# Patient Record
Sex: Male | Born: 1935 | Hispanic: No | Marital: Married | Smoking: Never smoker
Health system: Southern US, Community
[De-identification: ages and names within clinical notes are randomized; demographics above are authoritative.]

## PROBLEM LIST (undated history)

## (undated) DIAGNOSIS — J45909 Unspecified asthma, uncomplicated: Secondary | ICD-10-CM

## (undated) DIAGNOSIS — E079 Disorder of thyroid, unspecified: Secondary | ICD-10-CM

## (undated) DIAGNOSIS — I639 Cerebral infarction, unspecified: Secondary | ICD-10-CM

## (undated) DIAGNOSIS — I10 Essential (primary) hypertension: Secondary | ICD-10-CM

---

## 2013-10-09 ENCOUNTER — Ambulatory Visit: Payer: Self-pay | Admitting: Physician Assistant

## 2013-10-22 ENCOUNTER — Encounter (HOSPITAL_COMMUNITY): Payer: Self-pay | Admitting: Emergency Medicine

## 2013-10-22 ENCOUNTER — Emergency Department (HOSPITAL_COMMUNITY): Payer: BC Managed Care – PPO

## 2013-10-22 ENCOUNTER — Emergency Department (HOSPITAL_COMMUNITY)
Admission: EM | Admit: 2013-10-22 | Discharge: 2013-10-23 | Disposition: A | Discharge status: Home / Self Care | Payer: BC Managed Care – PPO | Attending: Emergency Medicine | Admitting: Emergency Medicine

## 2013-10-22 ENCOUNTER — Emergency Department (INDEPENDENT_AMBULATORY_CARE_PROVIDER_SITE_OTHER)
Admission: EM | Admit: 2013-10-22 | Discharge: 2013-10-22 | Disposition: A | Discharge status: Nursing Home (Includes ICF) | Payer: BC Managed Care – PPO | Source: Home / Self Care | Attending: Family Medicine | Admitting: Family Medicine

## 2013-10-22 DIAGNOSIS — Y92009 Unspecified place in unspecified non-institutional (private) residence as the place of occurrence of the external cause: Secondary | ICD-10-CM | POA: Insufficient documentation

## 2013-10-22 DIAGNOSIS — S59909A Unspecified injury of unspecified elbow, initial encounter: Secondary | ICD-10-CM | POA: Insufficient documentation

## 2013-10-22 DIAGNOSIS — Z7902 Long term (current) use of antithrombotics/antiplatelets: Secondary | ICD-10-CM | POA: Insufficient documentation

## 2013-10-22 DIAGNOSIS — W19XXXA Unspecified fall, initial encounter: Secondary | ICD-10-CM

## 2013-10-22 DIAGNOSIS — S40029A Contusion of unspecified upper arm, initial encounter: Secondary | ICD-10-CM

## 2013-10-22 DIAGNOSIS — S79919A Unspecified injury of unspecified hip, initial encounter: Secondary | ICD-10-CM | POA: Insufficient documentation

## 2013-10-22 DIAGNOSIS — S40021A Contusion of right upper arm, initial encounter: Secondary | ICD-10-CM

## 2013-10-22 DIAGNOSIS — S6990XA Unspecified injury of unspecified wrist, hand and finger(s), initial encounter: Secondary | ICD-10-CM | POA: Insufficient documentation

## 2013-10-22 DIAGNOSIS — Z79899 Other long term (current) drug therapy: Secondary | ICD-10-CM | POA: Insufficient documentation

## 2013-10-22 DIAGNOSIS — I10 Essential (primary) hypertension: Secondary | ICD-10-CM | POA: Insufficient documentation

## 2013-10-22 DIAGNOSIS — S4980XA Other specified injuries of shoulder and upper arm, unspecified arm, initial encounter: Secondary | ICD-10-CM | POA: Insufficient documentation

## 2013-10-22 DIAGNOSIS — G8190 Hemiplegia, unspecified affecting unspecified side: Secondary | ICD-10-CM | POA: Insufficient documentation

## 2013-10-22 DIAGNOSIS — W010XXA Fall on same level from slipping, tripping and stumbling without subsequent striking against object, initial encounter: Secondary | ICD-10-CM | POA: Insufficient documentation

## 2013-10-22 DIAGNOSIS — Z8673 Personal history of transient ischemic attack (TIA), and cerebral infarction without residual deficits: Secondary | ICD-10-CM | POA: Insufficient documentation

## 2013-10-22 DIAGNOSIS — Y9389 Activity, other specified: Secondary | ICD-10-CM | POA: Insufficient documentation

## 2013-10-22 DIAGNOSIS — S46909A Unspecified injury of unspecified muscle, fascia and tendon at shoulder and upper arm level, unspecified arm, initial encounter: Secondary | ICD-10-CM | POA: Insufficient documentation

## 2013-10-22 DIAGNOSIS — J45909 Unspecified asthma, uncomplicated: Secondary | ICD-10-CM | POA: Insufficient documentation

## 2013-10-22 HISTORY — DX: Disorder of thyroid, unspecified: E07.9

## 2013-10-22 HISTORY — DX: Unspecified asthma, uncomplicated: J45.909

## 2013-10-22 HISTORY — DX: Cerebral infarction, unspecified: I63.9

## 2013-10-22 HISTORY — DX: Essential (primary) hypertension: I10

## 2013-10-22 LAB — POCT I-STAT, CHEM 8
BUN: 16 mg/dL (ref 6–23)
Calcium, Ion: 1.32 mmol/L — ABNORMAL HIGH (ref 1.13–1.30)
Chloride: 101 meq/L (ref 96–112)
Creatinine, Ser: 1 mg/dL (ref 0.50–1.35)
Glucose, Bld: 111 mg/dL — ABNORMAL HIGH (ref 70–99)
HCT: 34 % — ABNORMAL LOW (ref 39.0–52.0)
Hemoglobin: 11.6 g/dL — ABNORMAL LOW (ref 13.0–17.0)
Potassium: 4.3 meq/L (ref 3.5–5.1)
Sodium: 140 meq/L (ref 135–145)
TCO2: 28 mmol/L (ref 0–100)

## 2013-10-22 NOTE — ED Notes (Signed)
Pt. transferred from [facility] , pt. fell at home inside toilet this evening pt. family equesting x-rays on his right elbow ( swelling ) , right shoulder and right hip . Pt.has a history of CVA with right hemiplegia .

## 2013-10-22 NOTE — ED Provider Notes (Signed)
CSN: [id number]     Arrival date & time 10/22/13  2004 History   First MD Initiated Contact with Patient 10/22/13 2029     Chief Complaint  Patient presents with  . Fall   (Consider location/radiation/quality/duration/timing/severity/associated sxs/prior Treatment) Patient is a 79 y.o. male presenting with fall.  Fall   Language; [place]  Patient's brought in by wife and son for evaluation of injuries after a fall that happened earlier today. He has history of previous stroke that left him hemiplegic on the right side. While on the toilet the patient describes accidentally loosing his balance and slipping off the toilet falling onto his right elbow, right shoulder and right hip. The patient denies hitting his head or LOC but the son is not convinced because he fell alone and they had to pick him up off of the ground. He went to his religious ceremony and has been acting normal all day, eating and drinking. The patient has his doctorate in zoology and still is mentally sharp. He says that he does have a little bit of pain. Pt has an abrasion to right elbow.  Past Medical History  Diagnosis Date  . Stroke   . Thyroid disease   . Hypertension   . Asthma    History reviewed. No pertinent past surgical history. No family history on file. History  Substance Use Topics  . Smoking status: Never Smoker   . Smokeless tobacco: Not on file  . Alcohol Use: No    Review of Systems  ROS The patient denies anorexia, fever, weight loss,, vision loss, decreased hearing, hoarseness, chest pain, syncope, dyspnea on exertion, peripheral edema, balance deficits, hemoptysis, abdominal pain, melena, hematochezia, severe indigestion/heartburn, hematuria, incontinence, genital sores, muscle weakness, suspicious skin lesions, transient blindness, difficulty walking, depression, unusual weight change, abnormal bleeding, enlarged lymph nodes, angioedema, and breast masses.  Allergies  Review of patient's  allergies indicates no known allergies.  Home Medications   Current Outpatient Rx  Name  Route  Sig  Dispense  Refill  . amLODipine (NORVASC) 2.5 MG tablet   Oral   Take 2.5 mg by mouth daily before breakfast.         . atorvastatin (LIPITOR) 40 MG tablet   Oral   Take 40 mg by mouth at bedtime.         . calcium carbonate (OS-CAL - DOSED IN MG OF ELEMENTAL CALCIUM) 1250 MG tablet   Oral   Take 1 tablet by mouth 2 (two) times daily with a meal.         . Cholecalciferol (VITAMIN D) 2000 UNITS CAPS   Oral   Take 2,000 Units by mouth daily after breakfast.         . clopidogrel (PLAVIX) 75 MG tablet   Oral   Take 75 mg by mouth daily after breakfast.         . finasteride (PROSCAR) 5 MG tablet   Oral   Take 5 mg by mouth daily after breakfast.         . levothyroxine (SYNTHROID, LEVOTHROID) 75 MCG tablet   Oral   Take 75 mcg by mouth daily before breakfast.         . mirabegron ER (MYRBETRIQ) 25 MG TB24 tablet   Oral   Take 25 mg by mouth daily after breakfast.         . pyridOXINE (VITAMIN B-6) 100 MG tablet   Oral   Take 200 mg by mouth daily after breakfast.         .  QUEtiapine (SEROQUEL) 25 MG tablet   Oral   Take 25 mg by mouth at bedtime.         . senna-docusate (SENOKOT-S) 8.6-50 MG per tablet   Oral   Take 1-2 tablets by mouth 2 (two) times daily. 2 tablets right after breakfast and 1 tablet right after dinner         . silodosin (RAPAFLO) 8 MG CAPS capsule   Oral   Take 8 mg by mouth daily after supper.         . sodium chloride (OCEAN) 0.65 % nasal spray   Nasal   Place 2 sprays into the nose 4 (four) times daily.          BP 141/87  Pulse 73  Temp(Src) 99.3 F (37.4 C) (Oral)  Resp 18  SpO2 99% Physical Exam  Nursing note and vitals reviewed. Constitutional: He appears well-developed and well-nourished. No distress.  HENT:  Head: Normocephalic and atraumatic.  Eyes: Pupils are equal, round, and reactive to  light.  Neck: Normal range of motion. Neck supple.  Cardiovascular: Normal rate and regular rhythm.   Pulmonary/Chest: Effort normal.  Abdominal: Soft.  Musculoskeletal:  Pt has muscle atrophy due to inability to use elbow. No noticeable deformities. Pt has no strength at baseline. Some mild swelling and abrasions radial side of elbow.  The shoulder does not appear to be out of place. No bruising, induration, erythema. Pt has no strength at baseline.  Patient does not have feeling in right hip at baseline. Pt has no strength at baseline. No crepitus or obvious deformities. No ecchymosis, hematoma, or induration noted.    Neurological: He is alert.  Skin: Skin is warm and dry.    ED Course  Procedures (including critical care time) Labs Review Labs Reviewed  POCT I-STAT, CHEM 8 - Abnormal; Notable for the following:    Glucose, Bld 111 (*)    Calcium, Ion 1.32 (*)    Hemoglobin 11.6 (*)    HCT 34.0 (*)    All other components within normal limits   Imaging Review Dg Shoulder Right  10/22/2013   CLINICAL DATA:  Right shoulder pain following a fall.  EXAM: RIGHT SHOULDER - 2+ VIEW  COMPARISON:  None.  FINDINGS: Diffuse osteopenia. No fracture or dislocation seen. Mild to moderate inferior acromioclavicular spur formation.  IMPRESSION: No fracture or dislocation.   Electronically Signed   By: Gordan Payment M.D.   On: 10/22/2013 23:10   Dg Elbow Complete Right  10/22/2013   CLINICAL DATA:  Right elbow pain following a fall.  EXAM: RIGHT ELBOW - COMPLETE 3+ VIEW  COMPARISON:  None.  FINDINGS: The lateral view is limited by limited ability to positioned the patient. There is a fragmented olecranon spur. There is also spur formation at the radial head and neck junction, making it difficult to assess for a fracture in the region. Also noted is mild olecranon and anterior humeral spur formation. No gross effusion on the lateral view. Diffuse osteopenia.  IMPRESSION: 1. Chronically fragmented  versus acutely fractured olecranon spur. 2. Radial head and neck spur formation, making it difficult to assess for a fracture in that region. 3. Additional degenerative spur formation, as described above. 4. Limited lateral view with no gross effusion. 5.   Electronically Signed   By: Gordan Payment M.D.   On: 10/22/2013 23:20   Dg Hip Complete Right  10/22/2013   CLINICAL DATA:  Right hip pain following a fall.  EXAM:  RIGHT HIP - COMPLETE 2+ VIEW  COMPARISON:  None.  FINDINGS: Diffuse osteopenia. No fracture or dislocation seen. Lower lumbar spine degenerative changes.  IMPRESSION: No fracture or dislocation.   Electronically Signed   By: Gordan Payment M.D.   On: 10/22/2013 23:09   Ct Head Wo Contrast  10/22/2013   CLINICAL DATA:  Fall  EXAM: CT HEAD WITHOUT CONTRAST  TECHNIQUE: Contiguous axial images were obtained from the base of the skull through the vertex without intravenous contrast.  COMPARISON:  None.  FINDINGS: There is age-related atrophy with chronic microvascular ischemic disease.  There is extensive encephalomalacia throughout the left frontal and anterior temporal lobes, consistent with remote left MCA territory infarct. There is involvement of the left basal ganglia as well. There is associated mild ex vacuo dilatation of the left lateral ventricle. There is a homogeneous hypodense extra-axial fluid collection overlying the left cerebral hemisphere measuring 7 mm in maximal diameter, most compatible with a subdural hygroma related to volume loss. Scattered areas of encephalomalacia within the anterior right frontal lobe (series 2, image 17) and right centrum semi ovale (series 2, image 19) are also consistent with remote infarcts. Remote left occipital infarct also noted. No midline shift. No hydrocephalus. No acute intracranial hemorrhage or infarct identified.  The calvarium is intact. Orbits are within normal limits. Scattered mucoperiosteal thickening is present within the ethmoidal air cells  bilaterally. Paranasal sinuses are otherwise clear. No mastoid effusion.  IMPRESSION: 1. No acute intracranial process. 2. Extensive encephalomalacia throughout the left frontal and temporal lobes, compatible with remote left MCA territory infarct. 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 No midline shift or hydrocephalus. 3. Additional remote right frontal and left occipital infarcts as above. 4. Atrophy with chronic microvascular ischemic changes.   Electronically Signed   By: Rise Mu M.D.   On: 10/22/2013 23:02    EKG Interpretation   None       MDM   1. Fall at home, initial encounter   Dr. Judd Lien and I spoke with patient and family together. CT scan shows no new changes and xray shows possible small fracture through the olecranon spur. Otherwise no broken bones or concerning findings. Patient is safe to go home and may continue to do physical therapy at home.  79 y.o.[name] [name]'s evaluation in the Emergency Department is complete. It has been determined that no acute conditions requiring further emergency intervention are present at this time. The patient/guardian have been advised of the diagnosis and plan. We have discussed signs and symptoms that warrant return to the ED, such as changes or worsening in symptoms.  Vital signs are stable at discharge. Filed Vitals:   10/22/13 2305  BP: 141/87  Pulse: 73  Temp:   Resp: 18    Patient/guardian has voiced understanding and agreed to follow-up with the PCP or specialist.     Dorthula Matas, PA-C 10/22/13 2346

## 2013-10-22 NOTE — ED Provider Notes (Signed)
Medical screening examination/treatment/procedure(s) were performed by non-physician practitioner and as supervising physician I was immediately available for consultation/collaboration.  EKG Interpretation   None        Valicia Rief, MD 10/22/13 2359 

## 2013-10-22 NOTE — ED Notes (Signed)
Phlebotomy at bedside.

## 2013-10-22 NOTE — ED Notes (Signed)
79 yr old c/o shoulder, elbow pain - riight x today. Pt fell today No other complaints

## 2013-10-22 NOTE — ED Notes (Signed)
[place], PA at bedside  

## 2013-10-22 NOTE — ED Provider Notes (Addendum)
CSN: [id number]     Arrival date & time 10/22/13  1833 History   First MD Initiated Contact with Patient 10/22/13 1909     Chief Complaint  Patient presents with  . Shoulder Pain   (Consider location/radiation/quality/duration/timing/severity/associated sxs/prior Treatment) Patient is a 79 y.o. male presenting with shoulder pain. The history is provided by the patient, the spouse and a relative.  Shoulder Pain This is a new problem. The current episode started 3 to 5 hours ago (h/o right sided cva , fell off of toilet and landed on right arm, family concerned  about possible arm or hip fx.). The problem has not changed since onset.Pertinent negatives include no chest pain, no abdominal pain and no shortness of breath.    Past Medical History  Diagnosis Date  . Stroke    History reviewed. No pertinent past surgical history. No family history on file. History  Substance Use Topics  . Smoking status: Never Smoker   . Smokeless tobacco: Not on file  . Alcohol Use: No    Review of Systems  Constitutional: Negative.   Respiratory: Negative for shortness of breath.   Cardiovascular: Negative for chest pain.  Gastrointestinal: Negative for abdominal pain.  Musculoskeletal: Positive for gait problem. Negative for joint swelling and neck pain.  Skin: Positive for wound.    Allergies  Review of patient's allergies indicates no known allergies.  Home Medications  No current outpatient prescriptions on file. BP 159/71  Pulse 77  Temp(Src) 98.4 F (36.9 C) (Oral)  Resp 18  SpO2 97% Physical Exam  Nursing note and vitals reviewed. Constitutional: He is oriented to person, place, and time. He appears well-developed and well-nourished.  HENT:  Head: Normocephalic and atraumatic.  Musculoskeletal: He exhibits no tenderness.  Hip flexion intact, no apparent injury.  Neurological: He is alert and oriented to person, place, and time.  Skin: Skin is warm and dry.  Right shoulder  abrasion and elbow contusion, no palp deformity.    ED Course  Procedures (including critical care time) Labs Review Labs Reviewed - No data to display Imaging Review No results found.  EKG Interpretation     Ventricular Rate:    PR Interval:    QRS Duration:   QT Interval:    QTC Calculation:   R Axis:     Text Interpretation:              MDM   1. Arm contusion, right, initial encounter    Sent for x-ray eval of arm and possibly hip to r/o fx.   Linna Hoff, MD 10/22/13 4098  Linna Hoff, MD 10/22/13 2006

## 2013-11-15 ENCOUNTER — Inpatient Hospital Stay (HOSPITAL_COMMUNITY): Payer: BC Managed Care – PPO

## 2013-11-15 ENCOUNTER — Inpatient Hospital Stay (HOSPITAL_COMMUNITY)
Admission: EM | Admit: 2013-11-15 | Discharge: 2013-11-19 | DRG: 100 | Disposition: A | Discharge status: Home / Self Care | Payer: BC Managed Care – PPO | Attending: Family Medicine | Admitting: Family Medicine

## 2013-11-15 ENCOUNTER — Encounter (HOSPITAL_COMMUNITY): Payer: Self-pay | Admitting: Radiology

## 2013-11-15 ENCOUNTER — Emergency Department (HOSPITAL_COMMUNITY): Payer: BC Managed Care – PPO

## 2013-11-15 DIAGNOSIS — W19XXXA Unspecified fall, initial encounter: Secondary | ICD-10-CM | POA: Diagnosis present

## 2013-11-15 DIAGNOSIS — I69922 Dysarthria following unspecified cerebrovascular disease: Secondary | ICD-10-CM

## 2013-11-15 DIAGNOSIS — E039 Hypothyroidism, unspecified: Secondary | ICD-10-CM | POA: Diagnosis present

## 2013-11-15 DIAGNOSIS — S065X9A Traumatic subdural hemorrhage with loss of consciousness of unspecified duration, initial encounter: Secondary | ICD-10-CM

## 2013-11-15 DIAGNOSIS — E079 Disorder of thyroid, unspecified: Secondary | ICD-10-CM | POA: Diagnosis present

## 2013-11-15 DIAGNOSIS — I69992 Facial weakness following unspecified cerebrovascular disease: Secondary | ICD-10-CM

## 2013-11-15 DIAGNOSIS — R569 Unspecified convulsions: Principal | ICD-10-CM | POA: Diagnosis present

## 2013-11-15 DIAGNOSIS — I6350 Cerebral infarction due to unspecified occlusion or stenosis of unspecified cerebral artery: Secondary | ICD-10-CM

## 2013-11-15 DIAGNOSIS — J45909 Unspecified asthma, uncomplicated: Secondary | ICD-10-CM | POA: Diagnosis present

## 2013-11-15 DIAGNOSIS — I639 Cerebral infarction, unspecified: Secondary | ICD-10-CM | POA: Diagnosis present

## 2013-11-15 DIAGNOSIS — S065XAA Traumatic subdural hemorrhage with loss of consciousness status unknown, initial encounter: Secondary | ICD-10-CM | POA: Diagnosis present

## 2013-11-15 DIAGNOSIS — I6200 Nontraumatic subdural hemorrhage, unspecified: Secondary | ICD-10-CM

## 2013-11-15 DIAGNOSIS — I69959 Hemiplegia and hemiparesis following unspecified cerebrovascular disease affecting unspecified side: Secondary | ICD-10-CM

## 2013-11-15 DIAGNOSIS — E785 Hyperlipidemia, unspecified: Secondary | ICD-10-CM | POA: Diagnosis present

## 2013-11-15 DIAGNOSIS — Y998 Other external cause status: Secondary | ICD-10-CM

## 2013-11-15 DIAGNOSIS — I10 Essential (primary) hypertension: Secondary | ICD-10-CM | POA: Diagnosis present

## 2013-11-15 DIAGNOSIS — Z7902 Long term (current) use of antithrombotics/antiplatelets: Secondary | ICD-10-CM

## 2013-11-15 DIAGNOSIS — G9389 Other specified disorders of brain: Secondary | ICD-10-CM | POA: Diagnosis present

## 2013-11-15 DIAGNOSIS — Y92009 Unspecified place in unspecified non-institutional (private) residence as the place of occurrence of the external cause: Secondary | ICD-10-CM

## 2013-11-15 LAB — CBC
HCT: 34.6 % — ABNORMAL LOW (ref 39.0–52.0)
Hemoglobin: 11.1 g/dL — ABNORMAL LOW (ref 13.0–17.0)
MCH: 27.5 pg (ref 26.0–34.0)
MCHC: 32.1 g/dL (ref 30.0–36.0)
MCV: 85.6 fL (ref 78.0–100.0)
Platelets: 218 10*3/uL (ref 150–400)

## 2013-11-15 LAB — COMPREHENSIVE METABOLIC PANEL
ALT: 18 U/L (ref 0–53)
Albumin: 2.8 g/dL — ABNORMAL LOW (ref 3.5–5.2)
Alkaline Phosphatase: 88 U/L (ref 39–117)
BUN: 15 mg/dL (ref 6–23)
Calcium: 9.3 mg/dL (ref 8.4–10.5)
GFR calc Af Amer: >90 mL/min (ref >90)
Glucose, Bld: 147 mg/dL — ABNORMAL HIGH (ref 70–99)
Potassium: 4.1 mEq/L (ref 3.5–5.1)
Sodium: 138 mEq/L (ref 135–145)
Total Protein: 7.5 g/dL (ref 6.0–8.3)

## 2013-11-15 LAB — POCT I-STAT, CHEM 8
Calcium, Ion: 1.31 mmol/L — ABNORMAL HIGH (ref 1.13–1.30)
Chloride: 99 mEq/L (ref 96–112)
HCT: 34 % — ABNORMAL LOW (ref 39.0–52.0)
TCO2: 27 mmol/L (ref 0–100)

## 2013-11-15 LAB — PROTIME-INR
INR: 0.93 (ref 0.00–1.49)
Prothrombin Time: 12.3 seconds (ref 11.6–15.2)

## 2013-11-15 LAB — POCT I-STAT TROPONIN I: Troponin i, poc: 0 ng/mL (ref 0.00–0.08)

## 2013-11-15 LAB — DIFFERENTIAL
Basophils Relative: 1 % (ref 0–1)
Eosinophils Absolute: 0.1 10*3/uL (ref 0.0–0.7)
Eosinophils Relative: 2 % (ref 0–5)
Lymphs Abs: 1.8 10*3/uL (ref 0.7–4.0)
Neutrophils Relative %: 64 % (ref 43–77)

## 2013-11-15 LAB — TROPONIN I: Troponin I: <0.3 ng/mL (ref <0.30)

## 2013-11-15 LAB — GLUCOSE, CAPILLARY: Glucose-Capillary: 123 mg/dL — ABNORMAL HIGH (ref 70–99)

## 2013-11-15 MED ORDER — ALBUTEROL SULFATE (5 MG/ML) 0.5% IN NEBU: 2.5000 mg | INHALATION_SOLUTION | RESPIRATORY_TRACT | Status: DC | PRN

## 2013-11-15 MED ORDER — MIRABEGRON ER 25 MG PO TB24
25.0000 mg | ORAL_TABLET | Freq: Every day | ORAL | Status: DC
  Administered 2013-11-16 – 2013-11-19 (×4): 25 mg via ORAL
  Filled 2013-11-15 (×5): qty 1

## 2013-11-15 MED ORDER — POLYETHYLENE GLYCOL 3350 17 G PO PACK
17.0000 g | PACK | Freq: Every day | ORAL | Status: DC | PRN
  Filled 2013-11-15: qty 1

## 2013-11-15 MED ORDER — SENNOSIDES-DOCUSATE SODIUM 8.6-50 MG PO TABS
1.0000 | ORAL_TABLET | Freq: Two times a day (BID) | ORAL | Status: DC
  Filled 2013-11-15: qty 2

## 2013-11-15 MED ORDER — SENNOSIDES-DOCUSATE SODIUM 8.6-50 MG PO TABS
2.0000 | ORAL_TABLET | Freq: Every day | ORAL | Status: DC
  Administered 2013-11-16 – 2013-11-19 (×4): 2 via ORAL
  Filled 2013-11-15 (×4): qty 2

## 2013-11-15 MED ORDER — ATORVASTATIN CALCIUM 40 MG PO TABS
40.0000 mg | ORAL_TABLET | Freq: Every day | ORAL | Status: DC
  Administered 2013-11-16 – 2013-11-18 (×3): 40 mg via ORAL
  Filled 2013-11-15 (×5): qty 1

## 2013-11-15 MED ORDER — HYDROCODONE-ACETAMINOPHEN 5-325 MG PO TABS: 1.0000 | ORAL_TABLET | ORAL | Status: DC | PRN

## 2013-11-15 MED ORDER — GUAIFENESIN-DM 100-10 MG/5ML PO SYRP: 5.0000 mL | ORAL_SOLUTION | ORAL | Status: DC | PRN

## 2013-11-15 MED ORDER — TAMSULOSIN HCL 0.4 MG PO CAPS
0.4000 mg | ORAL_CAPSULE | Freq: Every day | ORAL | Status: DC
  Administered 2013-11-16 – 2013-11-19 (×4): 0.4 mg via ORAL
  Filled 2013-11-15 (×5): qty 1

## 2013-11-15 MED ORDER — LEVOTHYROXINE SODIUM 75 MCG PO TABS
75.0000 ug | ORAL_TABLET | Freq: Every day | ORAL | Status: DC
  Administered 2013-11-16 – 2013-11-19 (×4): 75 ug via ORAL
  Filled 2013-11-15 (×5): qty 1

## 2013-11-15 MED ORDER — QUETIAPINE FUMARATE 25 MG PO TABS
25.0000 mg | ORAL_TABLET | Freq: Every day | ORAL | Status: DC
  Administered 2013-11-16 – 2013-11-18 (×3): 25 mg via ORAL
  Filled 2013-11-15 (×5): qty 1

## 2013-11-15 MED ORDER — SENNOSIDES-DOCUSATE SODIUM 8.6-50 MG PO TABS
1.0000 | ORAL_TABLET | Freq: Every day | ORAL | Status: DC
  Administered 2013-11-16 – 2013-11-18 (×2): 1 via ORAL
  Filled 2013-11-15 (×2): qty 1

## 2013-11-15 MED ORDER — ALUM & MAG HYDROXIDE-SIMETH 200-200-20 MG/5ML PO SUSP: 30.0000 mL | Freq: Four times a day (QID) | ORAL | Status: DC | PRN

## 2013-11-15 MED ORDER — SODIUM CHLORIDE 0.9 % IV SOLN
INTRAVENOUS | Status: AC
  Administered 2013-11-15: 18:00:00 via INTRAVENOUS

## 2013-11-15 MED ORDER — ONDANSETRON HCL 4 MG/2ML IJ SOLN: 4.0000 mg | Freq: Four times a day (QID) | INTRAMUSCULAR | Status: DC | PRN

## 2013-11-15 MED ORDER — LEVETIRACETAM 500 MG/5ML IV SOLN
1000.0000 mg | INTRAVENOUS | Status: AC
  Administered 2013-11-15: 1000 mg via INTRAVENOUS
  Filled 2013-11-15: qty 10

## 2013-11-15 MED ORDER — AMLODIPINE BESYLATE 2.5 MG PO TABS
2.5000 mg | ORAL_TABLET | Freq: Every day | ORAL | Status: DC
  Administered 2013-11-16 – 2013-11-19 (×4): 2.5 mg via ORAL
  Filled 2013-11-15 (×5): qty 1

## 2013-11-15 MED ORDER — HYDRALAZINE HCL 20 MG/ML IJ SOLN: 10.0000 mg | Freq: Four times a day (QID) | INTRAMUSCULAR | Status: DC | PRN

## 2013-11-15 MED ORDER — SODIUM CHLORIDE 0.9 % IV SOLN
500.0000 mg | Freq: Two times a day (BID) | INTRAVENOUS | Status: DC
  Administered 2013-11-16 – 2013-11-17 (×3): 500 mg via INTRAVENOUS
  Filled 2013-11-15 (×5): qty 5

## 2013-11-15 MED ORDER — ONDANSETRON HCL 4 MG PO TABS: 4.0000 mg | ORAL_TABLET | Freq: Four times a day (QID) | ORAL | Status: DC | PRN

## 2013-11-15 MED ORDER — FINASTERIDE 5 MG PO TABS
5.0000 mg | ORAL_TABLET | Freq: Every day | ORAL | Status: DC
  Administered 2013-11-16 – 2013-11-19 (×4): 5 mg via ORAL
  Filled 2013-11-15 (×5): qty 1

## 2013-11-15 MED ORDER — SODIUM CHLORIDE 0.9 % IJ SOLN
3.0000 mL | Freq: Two times a day (BID) | INTRAMUSCULAR | Status: DC
  Administered 2013-11-16 – 2013-11-19 (×7): 3 mL via INTRAVENOUS

## 2013-11-15 NOTE — ED Notes (Signed)
Checked patient blood sugar it was 142 notified RN Jennaya of blood sugar
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
## 2013-11-15 NOTE — H&P (Signed)
[facility]                                                                                    Patient Demographics  [name] [name], is a 79 y.o. male  MRN: [id number]   DOB - 1930/05/02  Admit Date - 11/15/2013  Outpatient Primary MD for the patient is Provider Not In System   With History of -  Past Medical History  Diagnosis Date  . Stroke   . Thyroid disease   . Hypertension   . Asthma       History reviewed. No pertinent past surgical history.  in for   Chief Complaint  Patient presents with  . Code Stroke     HPI  [name] [name]  is a 79 y.o. male, who has had left MCA territory ischemic stroke in the past causing a sizable encephalomalacia, right-sided hemiparesis secondary to previous CVA, hypertension, asthma, hypothyroidism, dyslipidemia who sustained a fall at his house about a month ago at which point he was brought in the ER and had an unremarkable workup except for head CT showing a subdural hygroma, he was subsequently discharged home and did well.    This afternoon patient had an episode of generalized tonic-clonic seizure which lasted a few seconds followed by a brief postictal phase, EMS was called however by the time EMS arrived patient was already recovering and was getting close to his baseline. He was then brought to the ER where her head CT now showed a subacute subdural hematoma with 3 mm midline shift, patient's mentation was close to his baseline and had no new focal deficits. He was seen in the ER by neurosurgeon Dr. Danielle Dess who requested me to admit the patient. Patient was also seen by neuro hospitalist team and was started on Keppra to prevent seizure recurrence.    Review of Systems  Negative currently  In addition to the HPI above,   No Fever-chills, No Headache, No changes with Vision or hearing, No problems swallowing food or Liquids, No Chest pain, Cough or Shortness of Breath, No Abdominal pain, No Nausea or Vommitting, Bowel movements  are regular, No Blood in stool or Urine, No dysuria, No new skin rashes or bruises, No new joints pains-aches,  No new weakness, tingling, numbness in any extremity, No recent weight gain or loss, No polyuria, polydypsia or polyphagia, No significant Mental Stressors.  A full 10 point Review of Systems was done, except as stated above, all other Review of Systems were negative.   Social History History  Substance Use Topics  . Smoking status: Never Smoker   . Smokeless tobacco: Not on file  . Alcohol Use: No      Family History  No H/O CVA    Prior to Admission medications   Medication Sig Start Date End Date Taking? Authorizing Provider  amLODipine (NORVASC) 2.5 MG tablet Take 2.5 mg by mouth daily before breakfast.    Historical Provider, MD  atorvastatin (LIPITOR) 40 MG tablet Take 40 mg by mouth at bedtime.    Historical Provider, MD  calcium carbonate (OS-CAL - DOSED IN MG OF ELEMENTAL CALCIUM) 1250 MG tablet Take 1 tablet  by mouth 2 (two) times daily with a meal.    Historical Provider, MD  Cholecalciferol (VITAMIN D) 2000 UNITS CAPS Take 2,000 Units by mouth daily after breakfast.    Historical Provider, MD  clopidogrel (PLAVIX) 75 MG tablet Take 75 mg by mouth daily after breakfast.    Historical Provider, MD  finasteride (PROSCAR) 5 MG tablet Take 5 mg by mouth daily after breakfast.    Historical Provider, MD  levothyroxine (SYNTHROID, LEVOTHROID) 75 MCG tablet Take 75 mcg by mouth daily before breakfast.    Historical Provider, MD  mirabegron ER (MYRBETRIQ) 25 MG TB24 tablet Take 25 mg by mouth daily after breakfast.    Historical Provider, MD  pyridOXINE (VITAMIN B-6) 100 MG tablet Take 200 mg by mouth daily after breakfast.    Historical Provider, MD  QUEtiapine (SEROQUEL) 25 MG tablet Take 25 mg by mouth at bedtime.    Historical Provider, MD  senna-docusate (SENOKOT-S) 8.6-50 MG per tablet Take 1-2 tablets by mouth 2 (two) times daily. 2 tablets right after  breakfast and 1 tablet right after dinner    Historical Provider, MD  silodosin (RAPAFLO) 8 MG CAPS capsule Take 8 mg by mouth daily after supper.    Historical Provider, MD  sodium chloride (OCEAN) 0.65 % nasal spray Place 2 sprays into the nose 4 (four) times daily.    Historical Provider, MD    No Known Allergies  Physical Exam  Vitals  Blood pressure 155/85, pulse 74, temperature 98 F (36.7 C), resp. rate 20, weight 64.581 kg (142 lb 6 oz), SpO2 100.00%.   1. General elderly male lying in bed in NAD,     2. Normal affect and insight, Not Suicidal or Homicidal, Awake Alert, Oriented X 3.  3. No F.N deficits, ALL C.Nerves Intact, Strength 5/5 left sided extremities and 0-1/5 R.side, Sensation intact L. extremities, Plantars down going on Left.  4. Ears and Eyes appear Normal, Conjunctivae clear, PERRLA. Moist Oral Mucosa.  5. Supple Neck, No JVD, No cervical lymphadenopathy appriciated, No Carotid Bruits.  6. Symmetrical Chest wall movement, Good air movement bilaterally, CTAB.  7. RRR, No Gallops, Rubs or Murmurs, No Parasternal Heave.  8. Positive Bowel Sounds, Abdomen Soft, Non tender, No organomegaly appriciated,No rebound -guarding or rigidity.  9.  No Cyanosis, Normal Skin Turgor, No Skin Rash or Bruise.  10. Good muscle tone,  joints appear normal , no effusions, Normal ROM.  11. No Palpable Lymph Nodes in Neck or Axillae     Data Review  CBC  Recent Labs Lab 11/15/13 1600 11/15/13 1611  WBC 6.8  --   HGB 11.1* 11.6*  HCT 34.6* 34.0*  PLT 218  --   MCV 85.6  --   MCH 27.5  --   MCHC 32.1  --   RDW 14.0  --   LYMPHSABS 1.8  --   MONOABS 0.5  --   EOSABS 0.1  --   BASOSABS 0.0  --    ------------------------------------------------------------------------------------------------------------------  Chemistries   Recent Labs Lab 11/15/13 1600 11/15/13 1611  NA 138 140  K 4.1 4.0  CL 101 99  CO2 28  --   GLUCOSE 147* 146*  BUN 15 15   CREATININE 0.78 1.10  CALCIUM 9.3  --   AST 22  --   ALT 18  --   ALKPHOS 88  --   BILITOT 0.1*  --    ------------------------------------------------------------------------------------------------------------------ CrCl is unknown because there is no height on file for  the current visit. ------------------------------------------------------------------------------------------------------------------ No results found for this basename: TSH, T4TOTAL, FREET3, T3FREE, THYROIDAB,  in the last 72 hours   Coagulation profile  Recent Labs Lab 11/15/13 1600  INR 0.93   ------------------------------------------------------------------------------------------------------------------- No results found for this basename: DDIMER,  in the last 72 hours -------------------------------------------------------------------------------------------------------------------  Cardiac Enzymes  Recent Labs Lab 11/15/13 1600  TROPONINI <0.30   ------------------------------------------------------------------------------------------------------------------ No components found with this basename: POCBNP,    ---------------------------------------------------------------------------------------------------------------  Urinalysis No results found for this basename: colorurine, appearanceur, labspec, phurine, glucoseu, hgbur, bilirubinur, ketonesur, proteinur, urobilinogen, nitrite, leukocytesur    ----------------------------------------------------------------------------------------------------------------  Imaging results:   Dg Shoulder Right  10/22/2013   CLINICAL DATA:  Right shoulder pain following a fall.  EXAM: RIGHT SHOULDER - 2+ VIEW  COMPARISON:  None.  FINDINGS: Diffuse osteopenia. No fracture or dislocation seen. Mild to moderate inferior acromioclavicular spur formation.  IMPRESSION: No fracture or dislocation.   Electronically Signed   By: Gordan Payment M.D.   On: 10/22/2013  23:10   Dg Elbow Complete Right  10/22/2013   CLINICAL DATA:  Right elbow pain following a fall.  EXAM: RIGHT ELBOW - COMPLETE 3+ VIEW  COMPARISON:  None.  FINDINGS: The lateral view is limited by limited ability to positioned the patient. There is a fragmented olecranon spur. There is also spur formation at the radial head and neck junction, making it difficult to assess for a fracture in the region. Also noted is mild olecranon and anterior humeral spur formation. No gross effusion on the lateral view. Diffuse osteopenia.  IMPRESSION: 1. Chronically fragmented versus acutely fractured olecranon spur. 2. Radial head and neck spur formation, making it difficult to assess for a fracture in that region. 3. Additional degenerative spur formation, as described above. 4. Limited lateral view with no gross effusion. 5.   Electronically Signed   By: Gordan Payment M.D.   On: 10/22/2013 23:20   Dg Hip Complete Right  10/22/2013   CLINICAL DATA:  Right hip pain following a fall.  EXAM: RIGHT HIP - COMPLETE 2+ VIEW  COMPARISON:  None.  FINDINGS: Diffuse osteopenia. No fracture or dislocation seen. Lower lumbar spine degenerative changes.  IMPRESSION: No fracture or dislocation.   Electronically Signed   By: Gordan Payment M.D.   On: 10/22/2013 23:09   Ct Head (brain) Wo Contrast  11/15/2013   CLINICAL DATA:  Code stroke  EXAM: CT HEAD WITHOUT CONTRAST  TECHNIQUE: Contiguous axial images were obtained from the base of the skull through the vertex without intravenous contrast.  COMPARISON:  None.  FINDINGS: Large area of chronic infarct in the left MCA territory. Chronic right frontal infarct. Chronic infarct in the right parietal white matter.  Large mixed density subdural hematoma on the left with a hematocrit level. This measures 24 mm in thickness. Smaller area of higher density hematoma in the subdural space posteriorly on the left.  3 mm midline shift to the right related to extensive volume loss from chronic left MCA  infarct. No acute ischemic infarct or mass.  IMPRESSION: Atrophy and chronic ischemia. Large area of chronic infarct in the left MCA territory.  Large mixed density subacute subdural hematoma on the left with 3 mm midline shift.  Critical Value/emergent results were called by telephone at the time of interpretation on 11/15/2013 at 4:05 PM to Dr.Camilo , who verbally acknowledged these results.   Electronically Signed   By: Marlan Palau M.D.   On: 11/15/2013 16:06   Ct Head Wo Contrast  10/22/2013   CLINICAL DATA:  Fall  EXAM: CT HEAD WITHOUT CONTRAST  TECHNIQUE: Contiguous axial images were obtained from the base of the skull through the vertex without intravenous contrast.  COMPARISON:  None.  FINDINGS: There is age-related atrophy with chronic microvascular ischemic disease.  There is extensive encephalomalacia throughout the left frontal and anterior temporal lobes, consistent with remote left MCA territory infarct. There is involvement of the left basal ganglia as well. There is associated mild ex vacuo dilatation of the left lateral ventricle. There is a homogeneous hypodense extra-axial fluid collection overlying the left cerebral hemisphere measuring 7 mm in maximal diameter, most compatible with a subdural hygroma related to volume loss. Scattered areas of encephalomalacia within the anterior right frontal lobe (series 2, image 17) and right centrum semi ovale (series 2, image 19) are also consistent with remote infarcts. Remote left occipital infarct also noted. No midline shift. No hydrocephalus. No acute intracranial hemorrhage or infarct identified.  The calvarium is intact. Orbits are within normal limits. Scattered mucoperiosteal thickening is present within the ethmoidal air cells bilaterally. Paranasal sinuses are otherwise clear. No mastoid effusion.  IMPRESSION: 1. No acute intracranial process. 2. Extensive encephalomalacia throughout the left frontal and temporal lobes, compatible with  remote left MCA territory infarct. 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 No midline shift or hydrocephalus. 3. Additional remote right frontal and left occipital infarcts as above. 4. Atrophy with chronic microvascular ischemic changes.   Electronically Signed   By: Rise Mu M.D.   On: 10/22/2013 23:02    My personal review of EKG: Rhythm NSR,   no Acute ST changes    Assessment & Plan   1. Generalized tonic-clonic seizure likely secondary to subdural hematoma with midline shift - will be admitted to step down, Keppra has been initiated and will be continued, will obtain EEG to rule out any ongoing seizure activity, his mental status is close to his baseline now. Neurology has been consulted and they will continue to follow the patient. We will continue to observe seizure precautions. Will have RN in evaluate his swallow function bedside and advance diet as tolerated to heart healthy.    2. Subdural hematoma. Likely secondary to his fall one month ago, he does not recall whether he hit his head, likely he did hit his head and with his large encephalomalacia from previous CVA in him being on Plavix he has had some bleed. He is being seen by neurosurgeon Dr. Danielle Dess, for now as he recommends supportive care only, we will hold his Plavix, fall precautions. Frequent neuro checks and monitor closely in stepped-down.   3. Hypertension. Stable his home medications will be continued, IV hydralazine and with written parameters.    4. Hypothyroidism. Continue home dose Synthroid check baseline TSH.    5. History of ischemic CVA in the past with severe right-sided hemiparesis and some dysarthria. For now and updated medications will be held second subdural hematoma, continue statin for secondary prevention. PT OT and speech evaluation.      DVT Prophylaxis  SCDs    AM Labs Ordered, also please  review Full Orders  Family Communication: Admission, patients condition and plan of care including tests being ordered have been discussed with the patient and Family who indicate understanding and agree with the plan and Code Status.  Code Status Full  Likely DC to Home  Condition GUARDED   Time spent in minutes :  35    Leroy Sea M.D on 11/15/2013 at 6:16 PM  Between 7am to 7pm - Pager - [phone]  After 7pm go to [email or web address] - password [email or web address]  And look for the night coverage person covering me after hours  [facility] Office  [phone]

## 2013-11-15 NOTE — ED Notes (Signed)
CBG 123. Notified RN 

## 2013-11-15 NOTE — Consult Note (Signed)
Reason for Consult: Subdural hematoma on left. Referring Physician: Dr. Sonny Dandy is an 79 y.o. male.  HPI: Patient is an 79 year old right-handed individual who's had a previous left-sided stroke affecting his speech and also right-sided movement the patient had what appears to be a seizure that started with a typical jacksonian march involving the right upper extremity and then becoming generalized. A typical postictal phase. He is now returned to his normal neurologic status where he has a substantial right-sided and he plegia. A CT scan demonstrates the presence of a left-sided subdural hematoma that is subacute and chronic. There is little mass effect from this hematoma as there is substantial atrophy from the patient's stroke involving the right  frontal temporal and parietal regions.  Past Medical History  Diagnosis Date  . Stroke   . Thyroid disease   . Hypertension   . Asthma     History reviewed. No pertinent past surgical history.  History reviewed. No pertinent family history.  Social History:  reports that he has never smoked. He does not have any smokeless tobacco history on file. He reports that he does not drink alcohol or use illicit drugs.  Allergies: No Known Allergies  Medications: I have reviewed the patient's current medications.  Results for orders placed during the hospital encounter of 11/15/13 (from the past 48 hour(s))  GLUCOSE, CAPILLARY     Status: Abnormal   Collection Time    11/15/13  3:47 PM      Result Value Range   Glucose-Capillary 142 (*) 70 - 99 mg/dL  PROTIME-INR     Status: None   Collection Time    11/15/13  4:00 PM      Result Value Range   Prothrombin Time 12.3  11.6 - 15.2 seconds   INR 0.93  0.00 - 1.49  APTT     Status: Abnormal   Collection Time    11/15/13  4:00 PM      Result Value Range   aPTT 23 (*) 24 - 37 seconds  CBC     Status: Abnormal   Collection Time    11/15/13  4:00 PM      Result Value Range   WBC  6.8  4.0 - 10.5 K/uL   RBC 4.04 (*) 4.22 - 5.81 MIL/uL   Hemoglobin 11.1 (*) 13.0 - 17.0 g/dL   HCT 40.9 (*) 81.1 - 91.4 %   MCV 85.6  78.0 - 100.0 fL   MCH 27.5  26.0 - 34.0 pg   MCHC 32.1  30.0 - 36.0 g/dL   RDW 78.2  95.6 - 21.3 %   Platelets 218  150 - 400 K/uL  DIFFERENTIAL     Status: None   Collection Time    11/15/13  4:00 PM      Result Value Range   Neutrophils Relative % 64  43 - 77 %   Neutro Abs 4.4  1.7 - 7.7 K/uL   Lymphocytes Relative 27  12 - 46 %   Lymphs Abs 1.8  0.7 - 4.0 K/uL   Monocytes Relative 7  3 - 12 %   Monocytes Absolute 0.5  0.1 - 1.0 K/uL   Eosinophils Relative 2  0 - 5 %   Eosinophils Absolute 0.1  0.0 - 0.7 K/uL   Basophils Relative 1  0 - 1 %   Basophils Absolute 0.0  0.0 - 0.1 K/uL  COMPREHENSIVE METABOLIC PANEL     Status: Abnormal   Collection  Time    11/15/13  4:00 PM      Result Value Range   Sodium 138  135 - 145 mEq/L   Potassium 4.1  3.5 - 5.1 mEq/L   Chloride 101  96 - 112 mEq/L   CO2 28  19 - 32 mEq/L   Glucose, Bld 147 (*) 70 - 99 mg/dL   BUN 15  6 - 23 mg/dL   Creatinine, Ser 1.61  0.50 - 1.35 mg/dL   Calcium 9.3  8.4 - 09.6 mg/dL   Total Protein 7.5  6.0 - 8.3 g/dL   Albumin 2.8 (*) 3.5 - 5.2 g/dL   AST 22  0 - 37 U/L   ALT 18  0 - 53 U/L   Alkaline Phosphatase 88  39 - 117 U/L   Total Bilirubin 0.1 (*) 0.3 - 1.2 mg/dL   GFR calc non Af Amer 82 (*) >90 mL/min   GFR calc Af Amer >90  >90 mL/min   Comment: (NOTE)     The eGFR has been calculated using the CKD EPI equation.     This calculation has not been validated in all clinical situations.     eGFR's persistently <90 mL/min signify possible Chronic Kidney     Disease.  TROPONIN I     Status: None   Collection Time    11/15/13  4:00 PM      Result Value Range   Troponin I <0.30  <0.30 ng/mL   Comment:            Due to the release kinetics of cTnI,     a negative result within the first hours     of the onset of symptoms does not rule out     myocardial  infarction with certainty.     If myocardial infarction is still suspected,     repeat the test at appropriate intervals.  POCT I-STAT TROPONIN I     Status: None   Collection Time    11/15/13  4:08 PM      Result Value Range   Troponin i, poc 0.00  0.00 - 0.08 ng/mL   Comment 3            Comment: Due to the release kinetics of cTnI,     a negative result within the first hours     of the onset of symptoms does not rule out     myocardial infarction with certainty.     If myocardial infarction is still suspected,     repeat the test at appropriate intervals.  POCT I-STAT, CHEM 8     Status: Abnormal   Collection Time    11/15/13  4:11 PM      Result Value Range   Sodium 140  135 - 145 mEq/L   Potassium 4.0  3.5 - 5.1 mEq/L   Chloride 99  96 - 112 mEq/L   BUN 15  6 - 23 mg/dL   Creatinine, Ser 0.45  0.50 - 1.35 mg/dL   Glucose, Bld 409 (*) 70 - 99 mg/dL   Calcium, Ion 8.11 (*) 1.13 - 1.30 mmol/L   TCO2 27  0 - 100 mmol/L   Hemoglobin 11.6 (*) 13.0 - 17.0 g/dL   HCT 91.4 (*) 78.2 - 95.6 %  GLUCOSE, CAPILLARY     Status: Abnormal   Collection Time    11/15/13  9:11 PM      Result Value Range   Glucose-Capillary 123 (*) 70 -  99 mg/dL    Dg Chest 2 View  16/12/958   CLINICAL DATA:  Subdural hematoma, admission radiograph  EXAM: CHEST  2 VIEW  COMPARISON:  None.  FINDINGS: Heart size within normal limits. Mild aortic tortuosity and central vascular prominence. No confluent airspace opacity, pleural effusion, or pneumothorax. Multilevel degenerative changes. No acute osseous finding.  IMPRESSION: No radiographic evidence of an acute cardiopulmonary process.   Electronically Signed   By: Jearld Lesch M.D.   On: 11/15/2013 23:24   Ct Head (brain) Wo Contrast  11/15/2013   CLINICAL DATA:  Code stroke  EXAM: CT HEAD WITHOUT CONTRAST  TECHNIQUE: Contiguous axial images were obtained from the base of the skull through the vertex without intravenous contrast.  COMPARISON:  None.   FINDINGS: Large area of chronic infarct in the left MCA territory. Chronic right frontal infarct. Chronic infarct in the right parietal white matter.  Large mixed density subdural hematoma on the left with a hematocrit level. This measures 24 mm in thickness. Smaller area of higher density hematoma in the subdural space posteriorly on the left.  3 mm midline shift to the right related to extensive volume loss from chronic left MCA infarct. No acute ischemic infarct or mass.  IMPRESSION: Atrophy and chronic ischemia. Large area of chronic infarct in the left MCA territory.  Large mixed density subacute subdural hematoma on the left with 3 mm midline shift.  Critical Value/emergent results were called by telephone at the time of interpretation on 11/15/2013 at 4:05 PM to Dr.Camilo , who verbally acknowledged these results.   Electronically Signed   By: Marlan Palau M.D.   On: 11/15/2013 16:06    Review of Systems  Constitutional: Negative.   HENT: Negative.   Eyes: Negative.   Cardiovascular: Negative.   Gastrointestinal: Negative.   Genitourinary: Negative.   Musculoskeletal:       And he plegia on right side of arm and leg  Skin: Negative.   Neurological:       Hemiplegia on right side of arm and let  Psychiatric/Behavioral: Negative.    Blood pressure 163/87, pulse 74, temperature 98.3 F (36.8 C), temperature source Oral, resp. rate 21, height 5' 4.5" (1.638 m), weight 63.1 kg (139 lb 1.8 oz), SpO2 99.00%. Physical Exam  Constitutional: He appears well-developed and well-nourished.  HENT:  Head: Normocephalic and atraumatic.  Eyes: Conjunctivae are normal. Pupils are equal, round, and reactive to light.  Neck: Normal range of motion. Neck supple.  Cardiovascular: Normal rate and regular rhythm.   Respiratory: Effort normal and breath sounds normal.  GI: Soft. Bowel sounds are normal.  Musculoskeletal:  Right-sided hemiplegia involving arm and leg. Some spastic movement of her right  lower extremity. Patient also has right facial.  Neurological:  Mild right facial. Right hemiplegia noted with significant spasticity. Absent reflexes in biceps triceps patellae and the Achilles.  Skin: Skin is warm.  Psychiatric: He has a normal mood and affect. His behavior is normal. Judgment and thought content normal.    Assessment/Plan: Patient is an 78 year old individual who has been on Plavix and aspirin secondary to ischemic strokes he now has evidence of a subacute and chronic subdural hematoma and recent new onset of seizures. I've advised that we treat seizures conservatively with medication and bring them under control. We did stop his Plavix and aspirin. At some point the patient may need surgical decompression of this large subdural hematoma however I noted to the family that unless it worsens or he  clinically deteriorates or his seizures are unable to be controlled I do not see a need for surgery at this time. Will follow with you.  Kaiyla Stahly J 11/15/2013, 11:37 PM

## 2013-11-15 NOTE — Consult Note (Signed)
Referring Physician: Silverio Lay    Chief Complaint: seizure brought in as code stroke.  HPI:                                                                                                                                         [name] [name] is an 79 y.o. male who was with his family when at 14:15 he was noted to have abnormal facial movements, then lost consciousness. By EMS arrival he has regained consciousness but was still slightly confused and dysarthric.  Due to his previous left MCA stroke patient is baseline dysarthric, left hemiparetic, and decreased sensation on left arm arm and leg. Currently he is improving but remains dysarthric but improving. He has a notable left part of his tongue which has been bitten.   Date last known well: Date: 11/15/2013 Time last known well: Time: 14:15 tPA Given: No: bleed  Past Medical History  Diagnosis Date  . Stroke   . Thyroid disease   . Hypertension   . Asthma     No past surgical history on file.  No family history on file. Social History:  reports that he has never smoked. He does not have any smokeless tobacco history on file. He reports that he does not drink alcohol or use illicit drugs.  Allergies: No Known Allergies  Medications:                                                                                                                           Current Facility-Administered Medications  Medication Dose Route Frequency Provider Last Rate Last Dose  . levETIRAcetam (KEPPRA) 1,000 mg in sodium chloride 0.9 % 100 mL IVPB  1,000 mg Intravenous STAT Ulice Dash, PA-C      . levETIRAcetam (KEPPRA) 500 mg in sodium chloride 0.9 % 100 mL IVPB  500 mg Intravenous Q12H Ulice Dash, PA-C       Current Outpatient Prescriptions  Medication Sig Dispense Refill  . amLODipine (NORVASC) 2.5 MG tablet Take 2.5 mg by mouth daily before breakfast.      . atorvastatin (LIPITOR) 40 MG tablet Take 40 mg by mouth at bedtime.      . calcium  carbonate (OS-CAL - DOSED IN MG OF ELEMENTAL CALCIUM) 1250 MG tablet Take 1 tablet by mouth 2 (two) times daily with a meal.      .  Cholecalciferol (VITAMIN D) 2000 UNITS CAPS Take 2,000 Units by mouth daily after breakfast.      . clopidogrel (PLAVIX) 75 MG tablet Take 75 mg by mouth daily after breakfast.      . finasteride (PROSCAR) 5 MG tablet Take 5 mg by mouth daily after breakfast.      . levothyroxine (SYNTHROID, LEVOTHROID) 75 MCG tablet Take 75 mcg by mouth daily before breakfast.      . mirabegron ER (MYRBETRIQ) 25 MG TB24 tablet Take 25 mg by mouth daily after breakfast.      . pyridOXINE (VITAMIN B-6) 100 MG tablet Take 200 mg by mouth daily after breakfast.      . QUEtiapine (SEROQUEL) 25 MG tablet Take 25 mg by mouth at bedtime.      . senna-docusate (SENOKOT-S) 8.6-50 MG per tablet Take 1-2 tablets by mouth 2 (two) times daily. 2 tablets right after breakfast and 1 tablet right after dinner      . silodosin (RAPAFLO) 8 MG CAPS capsule Take 8 mg by mouth daily after supper.      . sodium chloride (OCEAN) 0.65 % nasal spray Place 2 sprays into the nose 4 (four) times daily.         ROS:                                                                                                                                       History obtained from unobtainable from patient due to language barrier   Neurologic Examination:                                                                                                      Blood pressure 160/105, pulse 97, resp. rate 18, SpO2 100.00%.  Mental Status: Alert, oriented.  Speech dysarthric (old) without evidence of aphasia.  Able to follow simple commands without difficulty. Cranial Nerves: II: Discs flat bilaterally; Visual fields decrease right visual fiald, pupils equal, round, reactive to light and accommodation III,IV, VI: ptosis not present, extra-ocular motions intact bilaterally V,VII: smile asymmetric on the right, facial light  touch sensation decreased on the right VIII: hearing normal bilaterally IX,X: gag reflex present XI: bilateral shoulder shrug XII: midline tongue extension without atrophy or fasciculations  Motor: Right : Upper extremity   5/5    Left:     Upper extremity   0/5  Lower extremity   5/5     Lower extremity   2/5 proximal 0/5 distal Tone  and bulk:normal tone throughout; no atrophy noted Sensory: Pinprick and light touch intact throughout, bilaterally Deep Tendon Reflexes:  Right: Upper Extremity   Left: Upper extremity   biceps (C-5 to C-6) 2/4   biceps (C-5 to C-6) 2/4 tricep (C7) 2/4    triceps (C7) 2/4 Brachioradialis (C6) 2/4  Brachioradialis (C6) 2/4  Lower Extremity Lower Extremity  0/4 bilateral  Plantars: Mute bilateral Cerebellar: normal finger-to-nose on the left,  normal heel-to-shin test Gait: not tested due to acuity CV: pulses palpable throughout    No results found for this or any previous visit (from the past 48 hour(s)). No results found.  Assessment and plan discussed with with attending physician and they are in agreement.    Stroke Risk Factors - hypertension and stroke   Felicie Morn PA-C [facility] [phone]  11/15/2013, 3:49 PM   Assessment: 79 y.o. male brought to hospital after abnormal lip movements and loss of consciousness. CT head shows a large mixed density subacute subdural hematoma on the left with 3 mm midline shift.  Given history of abnormal lip movements and AMS likely seizure in the setting of acute on chronic SDH. Family has requested to have Dr. Pearlean Brownie see in AM  Recommend: 1) BP control with SBP <160 2) Step down ICU 3) Stop all antiplatelets 4) repeat CT head in AM 5) Continue Keppra 500 mg BID  Patient seen and examined together with physician assistant and I concur with the assessment and plan.  Wyatt Portela, MD

## 2013-11-15 NOTE — ED Provider Notes (Signed)
I saw and evaluated the patient, reviewed the resident's note and I agree with the findings and plan.  EKG Interpretation    Date/Time:  Wednesday November 15 2013 15:45:27 EST Ventricular Rate:  93 PR Interval:  177 QRS Duration: 79 QT Interval:  353 QTC Calculation: 439 R Axis:   9 Text Interpretation:  Sinus rhythm Abnormal R-wave progression, early transition No previous ECGs available Confirmed by YAO  MD, DAVID [phone]) on 11/15/2013 4:05:02 PM            [name] [name] is a 79 y.o. male hx of stroke with residual R sided weakness here with seizure, expressive aphasia. Went to take a nap around 1:45 PM today. Around 2:12 PM, he called daughter in law and wanted water. She gave him the water and his eyes rolled up and then he had lip smacking movement and tonic clonic shakes. Patient came in as code stroke. Larey Seat a month ago and had chronic subdural. On exam, he had R sided weakness (chronic), slurred speech. CT head showed subdural. I called neurosurgery, who recommend no intervention. Recommend admit to step down. Will admit to [facility] under [facility].   CRITICAL CARE Performed by: Silverio Lay, DAVID   Total critical care time: 30 min   Critical care time was exclusive of separately billable procedures and treating other patients.  Critical care was necessary to treat or prevent imminent or life-threatening deterioration.  Critical care was time spent personally by me on the following activities: development of treatment plan with patient and/or surrogate as well as nursing, discussions with consultants, evaluation of patient's response to treatment, examination of patient, obtaining history from patient or surrogate, ordering and performing treatments and interventions, ordering and review of laboratory studies, ordering and review of radiographic studies, pulse oximetry and re-evaluation of patient's condition.    Richardean Canal, MD 11/15/13 [phone]

## 2013-11-15 NOTE — ED Provider Notes (Signed)
CSN: [id number]     Arrival date & time 11/15/13  1518 History   First MD Initiated Contact with Patient 11/15/13 1536     Chief Complaint  Patient presents with  . Code Stroke   (Consider location/radiation/quality/duration/timing/severity/associated sxs/prior Treatment) The history is provided by the spouse.   Chief complaint: Weakness/altered mental status  79 year old male past medical history significant for previous CVA, thyroid disease, hypertension with altered mental status. Patient was in usual state of health until time for nap this afternoon. Patient was reported to down for nap approximately 2:00 PM. Around 2:50 PM family noticed patient was having lipsmacking. Also noted to have work finding difficulties above baseline and some shaking. Last unknown time. Patient had one episode of consciousness. Slowly resolving. And interventions taken. At baseline patient does have right-sided paralysis secondary to previous CVA. Symptoms were initially severe, however improving. For all other associated signs and symptoms refer to review of systems section of this chart   Past Medical History  Diagnosis Date  . Stroke   . Thyroid disease   . Hypertension   . Asthma    History reviewed. No pertinent past surgical history. History reviewed. No pertinent family history. History  Substance Use Topics  . Smoking status: Never Smoker   . Smokeless tobacco: Not on file  . Alcohol Use: No    Review of Systems  Unable to perform ROS: Acuity of condition    Allergies  Review of patient's allergies indicates no known allergies.  Home Medications   Current Outpatient Rx  Name  Route  Sig  Dispense  Refill  . amLODipine (NORVASC) 2.5 MG tablet   Oral   Take 2.5 mg by mouth daily before breakfast.         . atorvastatin (LIPITOR) 40 MG tablet   Oral   Take 40 mg by mouth at bedtime.         . calcium carbonate (OS-CAL - DOSED IN MG OF ELEMENTAL CALCIUM) 1250 MG tablet    Oral   Take 1 tablet by mouth 2 (two) times daily with a meal.         . Cholecalciferol (VITAMIN D) 2000 UNITS CAPS   Oral   Take 2,000 Units by mouth daily after breakfast.         . clopidogrel (PLAVIX) 75 MG tablet   Oral   Take 75 mg by mouth daily after breakfast.         . finasteride (PROSCAR) 5 MG tablet   Oral   Take 5 mg by mouth daily after breakfast.         . levothyroxine (SYNTHROID, LEVOTHROID) 75 MCG tablet   Oral   Take 75 mcg by mouth daily before breakfast.         . mirabegron ER (MYRBETRIQ) 25 MG TB24 tablet   Oral   Take 25 mg by mouth daily after breakfast.         . pyridOXINE (VITAMIN B-6) 100 MG tablet   Oral   Take 200 mg by mouth daily after breakfast.         . QUEtiapine (SEROQUEL) 25 MG tablet   Oral   Take 25 mg by mouth at bedtime.         . senna-docusate (SENOKOT-S) 8.6-50 MG per tablet   Oral   Take 1-2 tablets by mouth 2 (two) times daily. 2 tablets right after breakfast and 1 tablet right after dinner         .  silodosin (RAPAFLO) 8 MG CAPS capsule   Oral   Take 8 mg by mouth daily after supper.         . sodium chloride (OCEAN) 0.65 % nasal spray   Nasal   Place 2 sprays into the nose 4 (four) times daily.          BP 155/85  Pulse 74  Temp(Src) 98 F (36.7 C)  Resp 20  Wt 142 lb 6 oz (64.581 kg)  SpO2 100% Physical Exam  Nursing note and vitals reviewed. Constitutional: He appears well-developed and well-nourished.  HENT:  Head: Normocephalic and atraumatic.  Eyes: Pupils are equal, round, and reactive to light.  Neck: Normal range of motion.  Cardiovascular: Normal rate, regular rhythm and intact distal pulses.   Pulmonary/Chest: Effort normal and breath sounds normal. No respiratory distress. He exhibits no tenderness.  Abdominal: Soft. He exhibits no distension. There is no tenderness.  Musculoskeletal: Normal range of motion. He exhibits no edema.  Neurological: He is alert. No cranial  nerve deficit. He exhibits normal muscle tone. Coordination abnormal.  Residual right-sided weakness from previous CVA. Patient with dysarthria reported to be new from family. Patient with abnormal finger-nose-finger   Skin: Skin is warm and dry. No rash noted.  Psychiatric: He has a normal mood and affect. His behavior is normal. Judgment and thought content normal.    ED Course  Procedures (including critical care time) Labs Review Labs Reviewed  APTT - Abnormal; Notable for the following:    aPTT 23 (*)    All other components within normal limits  CBC - Abnormal; Notable for the following:    RBC 4.04 (*)    Hemoglobin 11.1 (*)    HCT 34.6 (*)    All other components within normal limits  COMPREHENSIVE METABOLIC PANEL - Abnormal; Notable for the following:    Glucose, Bld 147 (*)    Albumin 2.8 (*)    Total Bilirubin 0.1 (*)    GFR calc non Af Amer 82 (*)    All other components within normal limits  GLUCOSE, CAPILLARY - Abnormal; Notable for the following:    Glucose-Capillary 142 (*)    All other components within normal limits  POCT I-STAT, CHEM 8 - Abnormal; Notable for the following:    Glucose, Bld 146 (*)    Calcium, Ion 1.31 (*)    Hemoglobin 11.6 (*)    HCT 34.0 (*)    All other components within normal limits  PROTIME-INR  DIFFERENTIAL  TROPONIN I  TSH  POCT I-STAT TROPONIN I   Imaging Review Ct Head (brain) Wo Contrast  11/15/2013   CLINICAL DATA:  Code stroke  EXAM: CT HEAD WITHOUT CONTRAST  TECHNIQUE: Contiguous axial images were obtained from the base of the skull through the vertex without intravenous contrast.  COMPARISON:  None.  FINDINGS: Large area of chronic infarct in the left MCA territory. Chronic right frontal infarct. Chronic infarct in the right parietal white matter.  Large mixed density subdural hematoma on the left with a hematocrit level. This measures 24 mm in thickness. Smaller area of higher density hematoma in the subdural space  posteriorly on the left.  3 mm midline shift to the right related to extensive volume loss from chronic left MCA infarct. No acute ischemic infarct or mass.  IMPRESSION: Atrophy and chronic ischemia. Large area of chronic infarct in the left MCA territory.  Large mixed density subacute subdural hematoma on the left with 3 mm midline shift.  Critical Value/emergent results were called by telephone at the time of interpretation on 11/15/2013 at 4:05 PM to Dr.Camilo , who verbally acknowledged these results.   Electronically Signed   By: Marlan Palau M.D.   On: 11/15/2013 16:06    EKG Interpretation    Date/Time:  Wednesday November 15 2013 15:45:27 EST Ventricular Rate:  93 PR Interval:  177 QRS Duration: 79 QT Interval:  353 QTC Calculation: 439 R Axis:   9 Text Interpretation:  Sinus rhythm Abnormal R-wave progression, early transition No previous ECGs available Confirmed by YAO  MD, DAVID [phone]) on 11/15/2013 4:05:02 PM            MDM   1. Stroke   2. Thyroid disease   3. Hypertension   4. Asthma   5. Seizure   6. Subdural hematoma     On arrival patient was initially code stroke. Neurology bedside on arrival. Code stroke protocol initiated. Labs and imaging obtained. However based on patient's story and clinical examination believe this presentation is most consistent with a seizure. Per report patient had an episode of localize shaking was then unresponsive. Following this patient is been slowly return to baseline however still with some tests including dysarthria. Consistent with postictal period CT showed no large new areas of focal ischemia. Patient is at baseline with right-sided paralysis. Maurine Minister do not believe this is acute CVA this time. CT showed subdural hemorrhage. Patient with previous CT showing subdural hygroma. However appearance does appear to have changed with possible acute on chronic subdural. Neurosurgery contacted. Will follow however desired medicine admission  given seizures or other medical issues. Medicine consult and will admit to step down unit. Recommend holding Plavix. Remains stable in the ED until time of transfer to floor.     Bridgett Larsson, MD 11/15/13 [phone]

## 2013-11-15 NOTE — ED Notes (Signed)
Re-paged Dr. Danielle Dess to [phone]

## 2013-11-15 NOTE — Progress Notes (Signed)
Unit CM UR Completed by [facility] ED CM  W. Nilo Fallin RN  

## 2013-11-15 NOTE — ED Notes (Signed)
Presents with onset of left sided facial ticking and twiching with droop and dysarthia began at 1415 today, pt then became unresponsive for a few seconds and awoke confused. Code stroke called by EMS. Upon arrival, dysarthria and right sided paralysis noted. Right sided paralysis from previous stroke. PT taken to CT and neurology team at bedside.

## 2013-11-16 ENCOUNTER — Inpatient Hospital Stay (HOSPITAL_COMMUNITY): Payer: BC Managed Care – PPO

## 2013-11-16 ENCOUNTER — Encounter (HOSPITAL_COMMUNITY): Payer: Self-pay | Admitting: Radiology

## 2013-11-16 LAB — CBC
HCT: 32.7 % — ABNORMAL LOW (ref 39.0–52.0)
Hemoglobin: 10.4 g/dL — ABNORMAL LOW (ref 13.0–17.0)
MCH: 27.2 pg (ref 26.0–34.0)
RBC: 3.82 MIL/uL — ABNORMAL LOW (ref 4.22–5.81)

## 2013-11-16 LAB — BASIC METABOLIC PANEL
Chloride: 103 mEq/L (ref 96–112)
GFR calc Af Amer: >90 mL/min (ref >90)
GFR calc non Af Amer: 82 mL/min — ABNORMAL LOW (ref >90)
Glucose, Bld: 101 mg/dL — ABNORMAL HIGH (ref 70–99)
Potassium: 3.8 mEq/L (ref 3.5–5.1)
Sodium: 140 mEq/L (ref 135–145)

## 2013-11-16 LAB — TSH
TSH: 2.068 u[IU]/mL (ref 0.350–4.500)
TSH: 2.596 u[IU]/mL (ref 0.350–4.500)

## 2013-11-16 LAB — HEMOGLOBIN A1C: Mean Plasma Glucose: 140 mg/dL — ABNORMAL HIGH (ref <117)

## 2013-11-16 LAB — LIPID PANEL
HDL: 44 mg/dL (ref >39)
LDL Cholesterol: 59 mg/dL (ref 0–99)
Total CHOL/HDL Ratio: 2.6 RATIO
VLDL: 13 mg/dL (ref 0–40)

## 2013-11-16 LAB — MRSA PCR SCREENING: MRSA by PCR: NEGATIVE

## 2013-11-16 MED ORDER — STARCH (THICKENING) PO POWD
ORAL | Status: DC | PRN
  Filled 2013-11-16: qty 227

## 2013-11-16 NOTE — Progress Notes (Signed)
Stat EEG COMPLETED

## 2013-11-16 NOTE — Progress Notes (Signed)
Report called will transfer to 4N29 after done eating

## 2013-11-16 NOTE — Procedures (Signed)
EEG report.  Brief clinical history:  79 y/o with new onset partial seizure in the context of large subacute on chronic left hemisphere SDH. Patient is on keppra. Technique: this is a 17 channel routine scalp EEG performed at the bedside with bipolar and monopolar montages arranged in accordance to the international 10/20 system of electrode placement. One channel was dedicated to EKG recording.  The study was performed predominantly during drowsiness and stage 2 sleep, with very brief periods of wakefulness.. No activating procedures.  Description:patient is essentially in an out of drowsiness and asleep which precludes a reliable assessment of the best background. The cortical activity over the left cerebral hemisphere seems to be more suppressed than the right, but otherwise there is normal sleep architecture without evidence of superimposed epileptiform discharges. No electrographic seizures noted. EKG showed sinus rhythm.  Impression: this is an abnormal EGG with findings suggestive of a structural lesion involving the left hemisphere. No electrographic seizures noted. Clinical correlation is advised. Wyatt Portela, MD

## 2013-11-16 NOTE — Evaluation (Signed)
Physical Therapy Evaluation Patient Details Name: [name] [name] MRN: [id number] DOB: August 15, 1930 Today's Date: 11/16/2013 Time: 2725-3664 PT Time Calculation (min): 23 min  PT Assessment / Plan / Recommendation History of Present Illness  Patient is an 79 year old right-handed individual who's had a previous left-sided stroke affecting his speech and also right-sided movement the patient had what appears to be a seizure that started with a typical jacksonian march involving the right upper extremity and then becoming generalized. A typical postictal phase. He is now returned to his normal neurologic status where he has a substantial right-sided and he plegia. A CT scan demonstrates the presence of a left-sided subdural hematoma that is subacute and chronic. There is little mass effect from this hematoma as there is substantial atrophy from the patient's stroke involving the right  frontal temporal and parietal regions.  Clinical Impression  Pt admitted with the above. Pt currently with functional limitations due to the deficits listed below (see PT Problem List). No family present on evaluation and difficult to understand pt due to speech impairment.  Therefore difficult to determine best disposition.  Will continue to follow and assess pt.  Pt will benefit from skilled PT to increase their independence and safety with mobility to allow discharge to the venue listed below.      PT Assessment  Patient needs continued PT services    Follow Up Recommendations  Other (comment) (need to further assess caregiver support)    Equipment Recommendations  Other (comment) (TBD)    Frequency Min 3X/week    Precautions / Restrictions Precautions Precautions: Fall   Pertinent Vitals/Pain No c/o pain      Mobility  Bed Mobility Bed Mobility: Supine to Sit;Sitting - Scoot to Edge of Bed Supine to Sit: 4: Min assist;With rails Sitting - Scoot to [place] of Bed: 4: Min assist;With rail Details for Bed  Mobility Assistance: (A) to elevate trunnk OOB with cues for hand placement Transfers Transfers: Sit to Stand;Stand to Sit;Stand Pivot Transfers Sit to Stand: 1: +2 Total assist;From bed Sit to Stand: Patient Percentage: 60% Stand to Sit: 1: +2 Total assist;To chair/3-in-1 Stand to Sit: Patient Percentage: 60% Stand Pivot Transfers: 1: +2 Total assist;From elevated surface Stand Pivot Transfers: Patient Percentage: 60% Details for Transfer Assistance: (A) to initiate transfer and slowly descend to recliner with cues for technique.   Ambulation/Gait Ambulation/Gait Assistance: Not tested (comment) Modified Rankin (Stroke Patients Only) Pre-Morbid Rankin Score: Moderately severe disability Modified Rankin: Severe disability    Exercises     PT Diagnosis: Generalized weakness;Hemiplegia dominant side  PT Problem List: Decreased strength;Decreased activity tolerance;Decreased balance;Decreased mobility;Decreased knowledge of use of DME;Decreased safety awareness PT Treatment Interventions: DME instruction;Functional mobility training;Therapeutic activities;Therapeutic exercise;Balance training;Neuromuscular re-education;Patient/family education     PT Goals(Current goals can be found in the care plan section) Acute Rehab PT Goals Patient Stated Goal: Did not set PT Goal Formulation: With patient Time For Goal Achievement: 11/23/13 Potential to Achieve Goals: Good  Visit Information  Last PT Received On: 11/16/13 History of Present Illness: Patient is an 79 year old right-handed individual who's had a previous left-sided stroke affecting his speech and also right-sided movement the patient had what appears to be a seizure that started with a typical jacksonian march involving the right upper extremity and then becoming generalized. A typical postictal phase. He is now returned to his normal neurologic status where he has a substantial right-sided and he plegia. A CT scan demonstrates the  presence of a left-sided subdural hematoma that is  subacute and chronic. There is little mass effect from this hematoma as there is substantial atrophy from the patient's stroke involving the right  frontal temporal and parietal regions.       Prior Functioning  Home Living Family/patient expects to be discharged to:: Private residence Living Arrangements: Children Available Help at Discharge: Family;Available 24 hours/day Additional Comments: No family present on evaluation and difficult to understand pt.  Therefore limited home information gathered.  Prior Function Level of Independence: Needs assistance Comments: Anticipate pt needed assistance prior to admission however no family present to determine assistance needed.  Communication Communication: Expressive difficulties;Prefers language other than English (hx of aphasia)    Cognition  Cognition Arousal/Alertness: Awake/alert Behavior During Therapy: WFL for tasks assessed/performed Overall Cognitive Status: Difficult to assess Difficult to assess due to: Non-English speaking;Impaired communication    Extremity/Trunk Assessment Upper Extremity Assessment Upper Extremity Assessment: RUE deficits/detail RUE Deficits / Details: flaccid right UE Lower Extremity Assessment Lower Extremity Assessment: RLE deficits/detail RLE Deficits / Details: At least 2/5 gross    Balance Balance Balance Assessed: Yes Static Sitting Balance Static Sitting - Balance Support: Feet supported Static Sitting - Level of Assistance: 4: Min assist;5: Stand by assistance Static Sitting - Comment/# of Minutes: initial min (A) due to unsteady balance with right side lean.  Pt able to progress to minguard for safety.   End of Session PT - End of Session Equipment Utilized During Treatment: Gait belt Activity Tolerance: Patient tolerated treatment well Patient left: in chair;with call bell/phone within reach Nurse Communication: Mobility status  GP      Tyrhonda Georgiades 11/16/2013, 4:05 PM  Jake Shark, PT DPT [phone]

## 2013-11-16 NOTE — Progress Notes (Signed)
SLP Cancellation Note  Patient Details Name: [name] [name] MRN: [id number] DOB: 07-Jan-1930   Cancelled treatment:        Unable to complete BSE at this time. Pt is currently off unit.  ST to continue efforts.  Valli Randol B. Murvin [facility], CCC-SLP [phone]   Leigh Aurora 11/16/2013, 9:15 AM

## 2013-11-16 NOTE — Evaluation (Signed)
Clinical/Bedside Swallow Evaluation Patient Details  Name: [name] [name] MRN: [id number] Date of Birth: Jan 28, 1930  Today's Date: 11/16/2013 Time: 7829-5621 SLP Time Calculation (min): 55 min  Past Medical History:  Past Medical History  Diagnosis Date  . Stroke   . Thyroid disease   . Hypertension   . Asthma    Past Surgical History: History reviewed. No pertinent past surgical history. HPI:  Patient is an 79 year old right-handed individual who's had a previous left-sided stroke affecting his speech and also right-sided movement the patient had what appears to be a seizure that started with a typical jacksonian march involving the right upper extremity and then becoming generalized. A typical postictal phase. He is now returned to his normal neurologic status where he has a substantial right-sided and he plegia. A CT scan demonstrates the presence of a left-sided subdural hematoma that is subacute and chronic. There is little mass effect from this hematoma as there is substantial atrophy from the patient's stroke involving the right  frontal temporal and parietal regions.    Assessment / Plan / Recommendation Clinical Impression  Pt presents with reduced ROM and strength primarily on right for all oral motor functions tested, family stated pt with baseline weakness and dysarthria however deficits appear to have increased during current hospitalization. Provided trials of thin liquids, pt with throat clearing 2x and coughing 1x immediately following swallow during 5 trials. Pt's SPO2 stats dropped from 100 to mid-low 70s on each trial indicative of aspiration/penetration. During trials of nectar thick liquids, pt's SPO2 stats remained between 90-100 and no overt s/s of aspiration were observed.Pt with mild oral residuals with solids, however effective mastication and a-p transit. Pt demonstrates impulsive bx with po intake, family states this is pt's baseline function. Recommend diet of nectar  thick liquids and chopped solids with meds provided whole in puree. Full supervision for safe swallowing precautions secondary to impulsive bx. Family education provided regarding modified diet and recommendations for objective swallow test tomorrow AM.     Aspiration Risk  Moderate    Diet Recommendation Dysphagia 2 (Fine chop);Nectar-thick liquid   Liquid Administration via: Cup Medication Administration: Whole meds with puree Supervision: Full supervision/cueing for compensatory strategies;Staff to assist with self feeding Compensations: Slow rate;Small sips/bites;Check for anterior loss Postural Changes and/or Swallow Maneuvers: Seated upright 90 degrees;Upright 30-60 min after meal    Other  Recommendations Recommended Consults: MBS Oral Care Recommendations: Oral care BID Other Recommendations: Order thickener from pharmacy;Prohibited food (jello, ice cream, thin soups);Remove water pitcher   Follow Up Recommendations  Other (comment) (TBD)    Frequency and Duration min 2x/week  2 weeks   Pertinent Vitals/Pain No c/o of pain    SLP Swallow Goals  Goal 1: Pt will utilize recommended strategies during swallow to increase swallowing safety with mod independence Goal 2: Pt will consume trials of advanced consistencies with use of compensatory strategies given min verbal cues   Swallow Study Prior Functional Status  Available Help at Discharge: Family;Available 24 hours/day    General Date of Onset: 11/15/13 HPI: Patient is an 79 year old right-handed individual who's had a previous left-sided stroke affecting his speech and also right-sided movement the patient had what appears to be a seizure that started with a typical jacksonian march involving the right upper extremity and then becoming generalized. A typical postictal phase. He is now returned to his normal neurologic status where he has a substantial right-sided and he plegia. A CT scan demonstrates the presence of a  left-sided  subdural hematoma that is subacute and chronic. There is little mass effect from this hematoma as there is substantial atrophy from the patient's stroke involving the right  frontal temporal and parietal regions.  Type of Study: Bedside swallow evaluation Previous Swallow Assessment: BSE-06/11/13; Dys 3 diet, thin liquids Diet Prior to this Study: Dysphagia 3 (soft);Thin liquids Temperature Spikes Noted: No Respiratory Status: Room air History of Recent Intubation: No Behavior/Cognition: Alert;Cooperative Oral Cavity - Dentition: Adequate natural dentition;Missing dentition Self-Feeding Abilities: Needs set up;Able to feed self;Needs assist Patient Positioning: Upright in chair Baseline Vocal Quality: Clear Volitional Cough: Strong Volitional Swallow: Able to elicit    Oral/Motor/Sensory Function Overall Oral Motor/Sensory Function: Impaired at baseline Labial ROM: Reduced right Labial Symmetry: Abnormal symmetry right Labial Strength: Within Functional Limits Labial Sensation: Within Functional Limits Lingual ROM: Reduced right Lingual Symmetry: Abnormal symmetry right Lingual Strength: Reduced Lingual Sensation: Within Functional Limits Facial ROM: Reduced right Facial Symmetry: Right droop Facial Sensation: Within Functional Limits Velum: Within Functional Limits Mandible: Within Functional Limits   Ice Chips Ice chips: Within functional limits Presentation: Spoon   Thin Liquid Thin Liquid: Impaired Presentation: Cup;Self Fed Oral Phase Impairments: Reduced labial seal Oral Phase Functional Implications: Right anterior spillage Pharyngeal  Phase Impairments: Decreased hyoid-laryngeal movement;Suspected delayed Swallow;Throat Clearing - Immediate;Cough - Immediate;Change in Vital Signs;Multiple swallows    Nectar Thick Nectar Thick Liquid: Impaired Presentation: Self Fed;Cup Oral Phase Impairments: Reduced labial seal Oral phase functional implications: Right  anterior spillage Pharyngeal Phase Impairments: Suspected delayed Swallow;Decreased hyoid-laryngeal movement;Multiple swallows   Honey Thick Honey Thick Liquid: Not tested   Puree Puree: Within functional limits   Solid   GO    Solid: Impaired Presentation: Self Fed Oral Phase Functional Implications: Oral residue Pharyngeal Phase Impairments: Suspected delayed Swallow;Decreased hyoid-laryngeal movement       Chyrel Masson MA CCC-SLP 11/16/2013,6:32 PM

## 2013-11-16 NOTE — Progress Notes (Addendum)
[facility] Progress Note [name] [name]   [name] [name] GNF:[id number] DOB: Mar 25, 1930 DOA: 11/15/2013 PCP: Provider Not In System  Brief narrative: [name] [name] is a 79 y.o. male, who has had left MCA territory ischemic stroke in the past causing a sizable encephalomalacia, right-sided hemiparesis secondary to previous CVA, hypertension, asthma, hypothyroidism, dyslipidemia who sustained a fall at his house about a month ago at which point he was brought in the ER and had an unremarkable workup except for head CT showing a subdural hygroma, he was subsequently discharged home and did well.  The patient was brought to the hospital for a generalized tonic-clonic seizure. By the time he arrived to the ER, he was at baseline mental status. Imaging of the head revealed a large subdural hematoma which was resulting in a 3 mm midline shift. Neurosurgery and neurology were consult. Per neurosurgery, there was no necessity for surgery at the time of admission. It was decided to monitor the patient with serial imaging. MRI revealed the hematoma to be subacute to chronic and it is highly likely that it occurred as a result of the fall that happened about a month ago. Patient, who is quite alert now, has no recollection of any other injury after this fall.   Assessment/Plan: Principal Problem:   Seizure -Thus far appears to be controlled on Keppra which we will continue. -Appreciate neuro eval -EEG pending  Active Problems:    Stroke -With dysarthria, facial droop and right-sided weakness -Plavix and aspirin are currently on hold -Will await further information from neurology neurosurgery as to if and when these can be resumed -As noted under "subjective", patient has had slightly increased mobility of his right leg over the past few days -Continue physical therapy    Subdural hematoma -Patient previously had a hygroma which is suggestive of a subdural hemorrhage prior  to this one -Due to extensive underlying encephalomalacia, there has been no resultant neurologic injury from this current subdural -Repeat CT scan reveals the subdural hematoma to be stable  Thyroid disease -Continue medications    Hypertension -Amlodipine has been resumed    Asthma -Currently stable   Code Status: Full code  Family Communication: With son  Disposition Plan: Transfer to telemetry/neuro  Consultants: Neuro Neurosurgery  Procedures: EEG today  Antibiotics: None  DVT prophylaxis: SCDs  HPI/Subjective: Patient awake alert and quite interactive. Has numerous questions about what happened to bring him to the hospital. Has no significant complaints. He tells me that he has noticed increased strength of the right leg where he is now able to slightly bend his knee. He suspects it started 2-3 days ago and definitely prior to the seizure.   Objective: Blood pressure 136/101, pulse 62, temperature 97.5 F (36.4 C), temperature source Oral, resp. rate 18, height 5' 4.5" (1.638 m), weight 63.1 kg (139 lb 1.8 oz), SpO2 98.00%.  Intake/Output Summary (Last 24 hours) at 11/16/13 1535 Last data filed at 11/16/13 0800  Gross per 24 hour  Intake    105 ml  Output   1025 ml  Net   -920 ml     Exam: General: No acute respiratory distress Lungs: Clear to auscultation bilaterally without wheezes or crackles Cardiovascular: Regular rate and rhythm without murmur gallop or rub normal S1 and S2 Abdomen: Nontender, nondistended, soft, bowel sounds positive, no rebound, no ascites, no appreciable mass Extremities: No significant cyanosis, clubbing, or edema bilateral lower extremities  Data Reviewed: Basic Metabolic Panel:  Recent Labs  Lab 11/15/13 1600 11/15/13 1611 11/16/13 0415  NA 138 140 140  K 4.1 4.0 3.8  CL 101 99 103  CO2 28  --  26  GLUCOSE 147* 146* 101*  BUN 15 15 13   CREATININE 0.78 1.10 0.80  CALCIUM 9.3  --  9.5   Liver Function  Tests:  Recent Labs Lab 11/15/13 1600  AST 22  ALT 18  ALKPHOS 88  BILITOT 0.1*  PROT 7.5  ALBUMIN 2.8*   No results found for this basename: LIPASE, AMYLASE,  in the last 168 hours No results found for this basename: AMMONIA,  in the last 168 hours CBC:  Recent Labs Lab 11/15/13 1600 11/15/13 1611 11/16/13 0415  WBC 6.8  --  5.4  NEUTROABS 4.4  --   --   HGB 11.1* 11.6* 10.4*  HCT 34.6* 34.0* 32.7*  MCV 85.6  --  85.6  PLT 218  --  209   Cardiac Enzymes:  Recent Labs Lab 11/15/13 1600  TROPONINI <0.30   BNP (last 3 results) No results found for this basename: PROBNP,  in the last 8760 hours CBG:  Recent Labs Lab 11/15/13 1547 11/15/13 2111  GLUCAP 142* 123*    Recent Results (from the past 240 hour(s))  MRSA PCR SCREENING     Status: None   Collection Time    11/16/13  4:08 AM      Result Value Range Status   MRSA by PCR NEGATIVE  NEGATIVE Final   Comment:            The GeneXpert MRSA Assay (FDA     approved for NASAL specimens     only), is one component of a     comprehensive MRSA colonization     surveillance program. It is not     intended to diagnose MRSA     infection nor to guide or     monitor treatment for     MRSA infections.     Studies:  Recent x-ray studies have been reviewed in detail by the Attending Physician  Scheduled Meds:  Scheduled Meds: . amLODipine  2.5 mg Oral QAC breakfast  . atorvastatin  40 mg Oral QHS  . finasteride  5 mg Oral QPC breakfast  . levETIRAcetam  500 mg Intravenous Q12H  . levothyroxine  75 mcg Oral QAC breakfast  . mirabegron ER  25 mg Oral QPC breakfast  . QUEtiapine  25 mg Oral QHS  . senna-docusate  1 tablet Oral QPC supper  . senna-docusate  2 tablet Oral QPC breakfast  . sodium chloride  3 mL Intravenous Q12H  . tamsulosin  0.4 mg Oral QPC breakfast   Continuous Infusions:   Time spent on care of this patient: 35 min  [name], MD  [facility] Office   [phone] Pager - Text Page per [name] Stapler as per below:  On-Call/Text Page:      [name] [name]      password [email or web address]  If 7PM-7AM, please contact night-coverage [email or web address] Password [email or web address] 11/16/2013, 3:35 PM   LOS: 1 day

## 2013-11-16 NOTE — Progress Notes (Signed)
SLP Cancellation Note  Patient Details Name: [name] [name] MRN: [id number] DOB: [birth year]   Cancelled treatment:        Unable to complete BSE - Pt now having EEG. Will continue efforts. Yomar Mejorado B. Murvin Natal [facility], CCC-SLP [phone]  Leigh Aurora 11/16/2013, 10:25 AM

## 2013-11-16 NOTE — Progress Notes (Addendum)
NEURO HOSPITALIST PROGRESS NOTE   SUBJECTIVE:                                                                                                                         Patient is back to his baseline per family.  He showed no further seizures on Keppra. Family at bedside asking same questions about SDH multiple times.  Prolonged period of time was spent with family showing comparison films, explaining progression and treatment of SDH versus hygroma and seizure treatment /managment.  One hour spent in room.   EEG: this is an abnormal EGG with findings suggestive of a structural lesion involving the left hemisphere. No electrographic seizures noted   OBJECTIVE:                                                                                                                           Vital signs in last 24 hours: Temp:  [97.2 F (36.2 C)-98.3 F (36.8 C)] 97.5 F (36.4 C) (12/04 1122) Pulse Rate:  [52-97] 62 (12/04 1122) Resp:  [10-23] 18 (12/04 1122) BP: (127-175)/(70-105) 136/101 mmHg (12/04 1122) SpO2:  [95 %-100 %] 98 % (12/04 1122) Weight:  [63.1 kg (139 lb 1.8 oz)-64.581 kg (142 lb 6 oz)] 63.1 kg (139 lb 1.8 oz) (12/03 2300)  Intake/Output from previous day: 12/03 0701 - 12/04 0700 In: 105 [IV Piggyback:105] Out: 775 [Urine:775] Intake/Output this shift: Total I/O In: -  Out: 250 [Urine:250] Nutritional status: NPO  Past Medical History  Diagnosis Date  . Stroke   . Thyroid disease   . Hypertension   . Asthma       Neurologic Exam:  Mental Status:  Alert, oriented. Speech dysarthric (old) without evidence of aphasia. Able to follow simple commands without difficulty.  Cranial Nerves:  II:  Visual fields decrease right visual fiald, pupils equal, round, reactive to light and accommodation  III,IV, VI: ptosis not present, extra-ocular motions intact bilaterally  V,VII: smile asymmetric on the right (old), facial light touch  sensation decreased on the right  VIII: hearing normal bilaterally  IX,X: gag reflex present  XI: bilateral shoulder shrug  XII: midline tongue extension without atrophy or fasciculations  Motor:  Right :  Upper extremity 5/5  Left:  Upper extremity 0/5   Lower extremity 5/5    Lower extremity 2/5 proximal 0/5 distal  Tone and bulk:normal tone throughout; no atrophy noted  Sensory: Pinprick and light touch intact throughout, bilaterally  Deep Tendon Reflexes:  2+ bilateral UE Lower Extremity Lower Extremity  0/4 bilateral  Plantars:  Mute bilateral  Cerebellar:  normal finger-to-nose on the left, normal heel-to-shin test   Lab Results: Lab Results  Component Value Date/Time   CHOL 116 11/16/2013  4:15 AM   Lipid Panel  Recent Labs  11/16/13 0415  CHOL 116  TRIG 66  HDL 44  CHOLHDL 2.6  VLDL 13  LDLCALC 59    Studies/Results: Dg Chest 2 View  11/15/2013   CLINICAL DATA:  Subdural hematoma, admission radiograph  EXAM: CHEST  2 VIEW  COMPARISON:  None.  FINDINGS: Heart size within normal limits. Mild aortic tortuosity and central vascular prominence. No confluent airspace opacity, pleural effusion, or pneumothorax. Multilevel degenerative changes. No acute osseous finding.  IMPRESSION: No radiographic evidence of an acute cardiopulmonary process.   Electronically Signed   By: Jearld Lesch M.D.   On: 11/15/2013 23:24   Ct Head Wo Contrast  11/16/2013   CLINICAL DATA:  Subdural hemorrhage.  EXAM: CT HEAD WITHOUT CONTRAST  TECHNIQUE: Contiguous axial images were obtained from the base of the skull through the vertex without intravenous contrast.  COMPARISON:  11/15/2013  FINDINGS: Large subdural hematoma on the left is unchanged. This appears subacute with a hematocrit level. Smaller left occipital high-density subdural hematoma also unchanged.  Large area of chronic left MCA infarct with volume loss. Chronic ischemia in the frontal and parietal white matter on the right.  Chronic left parietal infarct posteriorly. Negative for acute infarct.  Mild midline shift measuring 2 mm to the right.  IMPRESSION: Large subacute subdural hematoma on the left is unchanged. Large left MCA chronic infarct. 2 mm midline shift to the right is unchanged.   Electronically Signed   By: Marlan Palau M.D.   On: 11/16/2013 09:28   Ct Head (brain) Wo Contrast  11/15/2013   CLINICAL DATA:  Code stroke  EXAM: CT HEAD WITHOUT CONTRAST  TECHNIQUE: Contiguous axial images were obtained from the base of the skull through the vertex without intravenous contrast.  COMPARISON:  None.  FINDINGS: Large area of chronic infarct in the left MCA territory. Chronic right frontal infarct. Chronic infarct in the right parietal white matter.  Large mixed density subdural hematoma on the left with a hematocrit level. This measures 24 mm in thickness. Smaller area of higher density hematoma in the subdural space posteriorly on the left.  3 mm midline shift to the right related to extensive volume loss from chronic left MCA infarct. No acute ischemic infarct or mass.  IMPRESSION: Atrophy and chronic ischemia. Large area of chronic infarct in the left MCA territory.  Large mixed density subacute subdural hematoma on the left with 3 mm midline shift.  Critical Value/emergent results were called by telephone at the time of interpretation on 11/15/2013 at 4:05 PM to Dr.Camilo , who verbally acknowledged these results.   Electronically Signed   By: Marlan Palau M.D.   On: 11/15/2013 16:06    MEDICATIONS  Scheduled: . amLODipine  2.5 mg Oral QAC breakfast  . atorvastatin  40 mg Oral QHS  . finasteride  5 mg Oral QPC breakfast  . levETIRAcetam  500 mg Intravenous Q12H  . levothyroxine  75 mcg Oral QAC breakfast  . mirabegron ER  25 mg Oral QPC breakfast  . QUEtiapine  25 mg Oral QHS  . senna-docusate   1 tablet Oral QPC supper  . senna-docusate  2 tablet Oral QPC breakfast  . sodium chloride  3 mL Intravenous Q12H  . tamsulosin  0.4 mg Oral QPC breakfast    ASSESSMENT/PLAN:                                                                                                            large left SDH of both chronic and acute densities. Repeat CT head showed no change and decreased shift to 2mm.  Neurosurgery has been consulted and feels they would like to watch for worsening of symptoms before intervening. EEG shows no epileptiform activity  Recommend: 1) Stop antiplatelets.  2) BP control with SBP <160 3) repeat CT head prior to discharge 4) If BP remains stable may be transferred to step down floor.  5) Patient will need out patient neurology follow up  ([facility] or [facility] ) 6) Continue Keppra 500 mg BID and may change to PO when taking pills.    Family at bedside asking same questions about SDH multiple times.  Prolonged period of time was spent with family showing comparison fils, explaining progression and treatment of SDH versus hygroma and seizure treatment /managment.  One hour spent in room.   Neurology S/O  Assessment and plan discussed with with attending physician and they are in agreement.    Felicie Morn PA-C [facility] [phone]  11/16/2013, 12:15 PM

## 2013-11-16 NOTE — Progress Notes (Signed)
OT Cancellation Note  Patient Details Name: [name] [name] MRN: [id number] DOB: December 20, 1929   Cancelled Treatment:    Reason Eval/Treat Not Completed: Patient at procedure or test/ unavailable Attempted to see this pm. Pt having EEG. Will see in am. [facility] Raj Landress, OTR/L  [phone] 11/16/2013 11/16/2013, 5:52 PM

## 2013-11-16 NOTE — Progress Notes (Signed)
Placed patient on his home CPAP.

## 2013-11-16 NOTE — Progress Notes (Signed)
Pt transferred to 4N 29. Receiving RN updated. Pt transferred via wheel chair accompanied by tech and family members.

## 2013-11-17 ENCOUNTER — Inpatient Hospital Stay (HOSPITAL_COMMUNITY): Payer: BC Managed Care – PPO

## 2013-11-17 MED ORDER — LEVETIRACETAM 500 MG PO TABS
500.0000 mg | ORAL_TABLET | Freq: Two times a day (BID) | ORAL | Status: DC
  Administered 2013-11-17 – 2013-11-19 (×4): 500 mg via ORAL
  Filled 2013-11-17 (×5): qty 1

## 2013-11-17 NOTE — Progress Notes (Signed)
[facility] PROGRESS NOTE  [name] [name] ZOX:[id number] DOB: 20-May-1930 DOA: 11/15/2013 PCP: Provider Not In System  Assessment/Plan: H/o Stroke  -With dysarthria, facial droop and right-sided weakness  -Plavix and aspirin are currently on hold  -Will await further information from neurology neurosurgery as to if and when these can be resumed  - Continue physical therapy   Subdural hematoma  -Patient previously had a hygroma which is suggestive of a subdural hemorrhage prior to this one  -Due to extensive underlying encephalomalacia, there has been no resultant neurologic injury from this current subdural  -Repeat CT scan reveals the subdural hematoma to be stable  - Will await the neurosurgery recommendations regarding surgery.  Seizure -EEG shows findings suggestive of structural lesion involving the left hemisphere. -No more seizures since patient is on Keppra 500 mg by mouth twice a day  Thyroid disease  -Continue medications   Hypertension  -Amlodipine has been resumed  - Blood pressure is stable  Asthma  -Currently stable    Code Status: Full code Family Communication: *Discussed with daughter at the bedside Disposition Plan: Home when stable   Consultants:  Neurology  Neurosurgery   Procedures:  *EEG  Antibiotics:  None  HPI/Subjective: Patient seen and examined, is alert and communicating though still has some slurred speech. Patient was seen by speech therapy yesterday and has been started on dysphagia 2 diet with nectar thick liquid. Daughter at the bedside. CT head done yesterday shows large subacute subdural hematoma on the left which is unchanged with persistent 2 mm midline shift to the right. No more seizures, currently he is on Keppra.  Objective: Filed Vitals:   11/17/13 0700  BP: 135/87  Pulse: 72  Temp: 97.6 F (36.4 C)  Resp: 18    Intake/Output Summary (Last 24 hours) at 11/17/13 1004 Last data filed at 11/17/13 0930  Gross  per 24 hour  Intake    825 ml  Output    875 ml  Net    -50 ml   Filed Weights   11/15/13 1544 11/15/13 2300 11/16/13 2101  Weight: 64.581 kg (142 lb 6 oz) 63.1 kg (139 lb 1.8 oz) 63.1 kg (139 lb 1.8 oz)    Exam:  Physical Exam: Head: Normocephalic, atraumatic.  Eyes: No signs of jaundice, EOMI Nose: Mucous membranes dry.  Throat: Oropharynx nonerythematous, no exudate appreciated.  Neck: supple,No deformities, masses, or tenderness noted. Lungs: Normal respiratory effort. B/L Clear to auscultation, no crackles or wheezes.  Heart: Regular RR. S1 and S2 normal  Abdomen: BS normoactive. Soft, Nondistended, non-tender.  Extremities: No pretibial edema, no erythema  Data Reviewed: Basic Metabolic Panel:  Recent Labs Lab 11/15/13 1600 11/15/13 1611 11/16/13 0415  NA 138 140 140  K 4.1 4.0 3.8  CL 101 99 103  CO2 28  --  26  GLUCOSE 147* 146* 101*  BUN 15 15 13   CREATININE 0.78 1.10 0.80  CALCIUM 9.3  --  9.5   Liver Function Tests:  Recent Labs Lab 11/15/13 1600  AST 22  ALT 18  ALKPHOS 88  BILITOT 0.1*  PROT 7.5  ALBUMIN 2.8*   No results found for this basename: LIPASE, AMYLASE,  in the last 168 hours No results found for this basename: AMMONIA,  in the last 168 hours CBC:  Recent Labs Lab 11/15/13 1600 11/15/13 1611 11/16/13 0415  WBC 6.8  --  5.4  NEUTROABS 4.4  --   --   HGB 11.1* 11.6* 10.4*  HCT 34.6* 34.0*  32.7*  MCV 85.6  --  85.6  PLT 218  --  209   Cardiac Enzymes:  Recent Labs Lab 11/15/13 1600  TROPONINI <0.30   BNP (last 3 results) No results found for this basename: PROBNP,  in the last 8760 hours CBG:  Recent Labs Lab 11/15/13 1547 11/15/13 2111  GLUCAP 142* 123*    Recent Results (from the past 240 hour(s))  MRSA PCR SCREENING     Status: None   Collection Time    11/16/13  4:08 AM      Result Value Range Status   MRSA by PCR NEGATIVE  NEGATIVE Final   Comment:            The GeneXpert MRSA Assay (FDA      approved for NASAL specimens     only), is one component of a     comprehensive MRSA colonization     surveillance program. It is not     intended to diagnose MRSA     infection nor to guide or     monitor treatment for     MRSA infections.     Studies: Dg Chest 2 View  11/15/2013   CLINICAL DATA:  Subdural hematoma, admission radiograph  EXAM: CHEST  2 VIEW  COMPARISON:  None.  FINDINGS: Heart size within normal limits. Mild aortic tortuosity and central vascular prominence. No confluent airspace opacity, pleural effusion, or pneumothorax. Multilevel degenerative changes. No acute osseous finding.  IMPRESSION: No radiographic evidence of an acute cardiopulmonary process.   Electronically Signed   By: Jearld Lesch M.D.   On: 11/15/2013 23:24   Ct Head Wo Contrast  11/16/2013   CLINICAL DATA:  Subdural hemorrhage.  EXAM: CT HEAD WITHOUT CONTRAST  TECHNIQUE: Contiguous axial images were obtained from the base of the skull through the vertex without intravenous contrast.  COMPARISON:  11/15/2013  FINDINGS: Large subdural hematoma on the left is unchanged. This appears subacute with a hematocrit level. Smaller left occipital high-density subdural hematoma also unchanged.  Large area of chronic left MCA infarct with volume loss. Chronic ischemia in the frontal and parietal white matter on the right. Chronic left parietal infarct posteriorly. Negative for acute infarct.  Mild midline shift measuring 2 mm to the right.  IMPRESSION: Large subacute subdural hematoma on the left is unchanged. Large left MCA chronic infarct. 2 mm midline shift to the right is unchanged.   Electronically Signed   By: Marlan Palau M.D.   On: 11/16/2013 09:28   Ct Head (brain) Wo Contrast  11/15/2013   CLINICAL DATA:  Code stroke  EXAM: CT HEAD WITHOUT CONTRAST  TECHNIQUE: Contiguous axial images were obtained from the base of the skull through the vertex without intravenous contrast.  COMPARISON:  None.  FINDINGS: Large  area of chronic infarct in the left MCA territory. Chronic right frontal infarct. Chronic infarct in the right parietal white matter.  Large mixed density subdural hematoma on the left with a hematocrit level. This measures 24 mm in thickness. Smaller area of higher density hematoma in the subdural space posteriorly on the left.  3 mm midline shift to the right related to extensive volume loss from chronic left MCA infarct. No acute ischemic infarct or mass.  IMPRESSION: Atrophy and chronic ischemia. Large area of chronic infarct in the left MCA territory.  Large mixed density subacute subdural hematoma on the left with 3 mm midline shift.  Critical Value/emergent results were called by telephone at the time of  interpretation on 11/15/2013 at 4:05 PM to Dr.Camilo , who verbally acknowledged these results.   Electronically Signed   By: Marlan Palau M.D.   On: 11/15/2013 16:06    Scheduled Meds: . amLODipine  2.5 mg Oral QAC breakfast  . atorvastatin  40 mg Oral QHS  . finasteride  5 mg Oral QPC breakfast  . levETIRAcetam  500 mg Intravenous Q12H  . levothyroxine  75 mcg Oral QAC breakfast  . mirabegron ER  25 mg Oral QPC breakfast  . QUEtiapine  25 mg Oral QHS  . senna-docusate  1 tablet Oral QPC supper  . senna-docusate  2 tablet Oral QPC breakfast  . sodium chloride  3 mL Intravenous Q12H  . tamsulosin  0.4 mg Oral QPC breakfast   Continuous Infusions:   Principal Problem:   Seizure Active Problems:   Stroke   Thyroid disease   Hypertension   Asthma   Subdural hematoma    Time spent: 35 min    [facility]  [facility] Pager [phone]. If 7PM-7AM, please contact night-coverage at [email or web address], password [facility] 11/17/2013, 10:04 AM  LOS: 2 days

## 2013-11-17 NOTE — Evaluation (Signed)
Occupational Therapy Evaluation Patient Details Name: [name] [name] MRN: [id number] DOB: 1929/12/16 Today's Date: 11/17/2013 Time: 3086-5784 OT Time Calculation (min): 23 min  OT Assessment / Plan / Recommendation History of present illness Patient is an 79 year old right-handed individual who's had a previous left-sided stroke affecting his speech and also right-sided movement the patient had what appears to be a seizure that started with a typical jacksonian march involving the right upper extremity and then becoming generalized. A typical postictal phase. He is now returned to his normal neurologic status where he has a substantial right-sided and he plegia. A CT scan demonstrates the presence of a left-sided subdural hematoma that is subacute and chronic. There is little mass effect from this hematoma as there is substantial atrophy from the patient's stroke involving the right  frontal temporal and parietal regions.   Clinical Impression   PT admitted with LT SDH. Pt currently with functional limitiations due to the deficits listed below (see OT problem list).  Pt will benefit from skilled OT to increase their independence and safety with adls and balance to allow discharge HHOT.     OT Assessment  Patient needs continued OT Services    Follow Up Recommendations  Home health OT    Barriers to Discharge      Equipment Recommendations  None recommended by OT    Recommendations for Other Services    Frequency  Min 2X/week    Precautions / Restrictions Precautions Precautions: Fall Precaution Comments: risk for subluxation of RT UE   Pertinent Vitals/Pain None reported    ADL  Toilet Transfer: +2 Total assistance Toilet Transfer: Patient Percentage: 70% Toilet Transfer Method: Stand pivot Toilet Transfer Equipment: Raised toilet seat with arms (or 3-in-1 over toilet) Equipment Used: Gait belt Transfers/Ambulation Related to ADLs: Pt completed stand pivot to the left side ADL  Comments: Pt completing bed mobility and voiding with urinal at eob. pt don underwear and sweat pants max (A).  Pt positioned in chair at end of session. pt demonstrates ability to turn on ipad and using lt ue to type in password to open ipad home screen. Pt is able to scan ipad music chart to locate morning prayer list without (A). Pt is near baseline per daughter report at this time. Pt is weaker with transfer but progressing well    OT Diagnosis: Generalized weakness;Hemiplegia dominant side  OT Problem List: Decreased strength;Decreased activity tolerance;Impaired balance (sitting and/or standing);Decreased safety awareness;Decreased knowledge of use of DME or AE;Decreased knowledge of precautions OT Treatment Interventions: Self-care/ADL training;Neuromuscular education;Therapeutic exercise;DME and/or AE instruction;Therapeutic activities;Patient/family education;Balance training   OT Goals(Current goals can be found in the care plan section) Acute Rehab OT Goals Patient Stated Goal: TO be able to stand for 30 minutes again OT Goal Formulation: With patient/family Time For Goal Achievement: 12/01/13 Potential to Achieve Goals: Good  Visit Information  Last OT Received On: 11/17/13 Assistance Needed: +1 History of Present Illness: Patient is an 79 year old right-handed individual who's had a previous left-sided stroke affecting his speech and also right-sided movement the patient had what appears to be a seizure that started with a typical jacksonian march involving the right upper extremity and then becoming generalized. A typical postictal phase. He is now returned to his normal neurologic status where he has a substantial right-sided and he plegia. A CT scan demonstrates the presence of a left-sided subdural hematoma that is subacute and chronic. There is little mass effect from this hematoma as there is substantial atrophy from the  patient's stroke involving the right  frontal temporal and  parietal regions.       Prior Functioning     Home Living Family/patient expects to be discharged to:: Private residence Living Arrangements: Children Available Help at Discharge: Family;Available 24 hours/day Type of Home: Apartment Home Access: Level entry Home Layout: One level Home Equipment: Bedside commode;Shower seat;Grab bars - toilet;Grab bars - tub/shower;Wheelchair - manual Additional Comments: hx provided by daughter Prior Function Level of Independence: Needs assistance Gait / Transfers Assistance Needed: patient w/c bound at baseline, SPT with min assist from fami ADL's / Homemaking Assistance Needed: Son assist daily with bathing, dressing, toileting Comments: Anticipate pt needed assistance prior to admission however no family present to determine assistance needed.  Communication Communication: Expressive difficulties;Prefers language other than English (hx of aphasia) Dominant Hand: Left         Vision/Perception     Cognition  Cognition Arousal/Alertness: Awake/alert Behavior During Therapy: WFL for tasks assessed/performed Overall Cognitive Status: Difficult to assess Difficult to assess due to: Non-English speaking;Impaired communication    Extremity/Trunk Assessment Upper Extremity Assessment Upper Extremity Assessment: RUE deficits/detail RUE Deficits / Details: flaccid right UE Lower Extremity Assessment Lower Extremity Assessment: Defer to PT evaluation     Mobility Bed Mobility Bed Mobility: Supine to Sit;Sitting - Scoot to Edge of Bed Supine to Sit: 3: Mod assist Sitting - Scoot to Edge of Bed: 4: Min assist;With rail Details for Bed Mobility Assistance: (A) to elevate trunnk OOB with cues for hand placement Transfers Sit to Stand: 1: +2 Total assist;From bed Sit to Stand: Patient Percentage: 70% Stand to Sit: 1: +2 Total assist;To chair/3-in-1 Stand to Sit: Patient Percentage: 70% Details for Transfer Assistance: (A) to initiate  transfer and slowly descend to recliner with cues for technique.       Exercise     Balance Balance Balance Assessed: Yes Static Sitting Balance Static Sitting - Balance Support: Feet supported Static Sitting - Level of Assistance: 6: Modified independent (Device/Increase time);5: Stand by assistance   End of Session OT - End of Session Activity Tolerance: Patient tolerated treatment well Patient left: in chair;with call bell/phone within reach Nurse Communication: Mobility status;Precautions  GO     Harolyn Rutherford 11/17/2013, 3:15 PM Pager: [phone]

## 2013-11-17 NOTE — Evaluation (Signed)
Physical Therapy Evaluation Patient Details Name: [name] [name] MRN: [id number] DOB: 11-12-1930 Today's Date: 11/17/2013 Time: 0925-1001 PT Time Calculation (min): 36 min  PT Assessment / Plan / Recommendation History of Present Illness  Patient is an 79 year old right-handed individual who's had a previous left-sided stroke affecting his speech and also right-sided movement the patient had what appears to be a seizure that started with a typical jacksonian march involving the right upper extremity and then becoming generalized. A typical postictal phase. He is now returned to his normal neurologic status where he has a substantial right-sided and he plegia. A CT scan demonstrates the presence of a left-sided subdural hematoma that is subacute and chronic. There is little mass effect from this hematoma as there is substantial atrophy from the patient's stroke involving the right  frontal temporal and parietal regions.  Clinical Impression  Per patient daughter, PTA pt was receiving HHPT services and required assist for mobility as well as daily activities. Feel patient will continued to benefit from skilled PT, recommend resumption of HHPT services upon discharge.     PT Assessment  Patient needs continued PT services    Follow Up Recommendations  Home health PT;Supervision/Assistance - 24 hour (Resume HHPT services currently in place)          Equipment Recommendations  None recommended by PT       Frequency Min 3X/week    Precautions / Restrictions Precautions Precautions: Fall Precaution Comments: risk for subluxation of RT UE   Pertinent Vitals/Pain No pain reported at this time      Mobility  Bed Mobility Bed Mobility: Supine to Sit;Sitting - Scoot to Edge of Bed Supine to Sit: 3: Mod assist Sitting - Scoot to Edge of Bed: 4: Min assist;With rail Details for Bed Mobility Assistance: (A) to elevate trunnk OOB with cues for hand placement Transfers Transfers: Sit to  Stand;Stand to Sit;Stand Pivot Transfers Sit to Stand: 1: +2 Total assist;From bed Sit to Stand: Patient Percentage: 70% Stand to Sit: 1: +2 Total assist;To chair/3-in-1 Stand to Sit: Patient Percentage: 70% Stand Pivot Transfers: 4: Min assist;From elevated surface Details for Transfer Assistance: (A) to initiate transfer and slowly descend to recliner with cues for technique.   Ambulation/Gait Ambulation/Gait Assistance: Not tested (comment) Modified Rankin (Stroke Patients Only) Pre-Morbid Rankin Score: Moderately severe disability Modified Rankin: Severe disability        PT Diagnosis: Generalized weakness;Hemiplegia dominant side  PT Problem List: Decreased strength;Decreased activity tolerance;Decreased balance;Decreased mobility;Decreased knowledge of use of DME;Decreased safety awareness PT Treatment Interventions: DME instruction;Functional mobility training;Therapeutic activities;Therapeutic exercise;Balance training;Neuromuscular re-education;Patient/family education     PT Goals(Current goals can be found in the care plan section) Acute Rehab PT Goals Patient Stated Goal: TO be able to stand for 30 minutes again PT Goal Formulation: With patient Time For Goal Achievement: 11/23/13 Potential to Achieve Goals: Good  Visit Information  Last PT Received On: 11/17/13 Assistance Needed: +1 History of Present Illness: Patient is an 79 year old right-handed individual who's had a previous left-sided stroke affecting his speech and also right-sided movement the patient had what appears to be a seizure that started with a typical jacksonian march involving the right upper extremity and then becoming generalized. A typical postictal phase. He is now returned to his normal neurologic status where he has a substantial right-sided and he plegia. A CT scan demonstrates the presence of a left-sided subdural hematoma that is subacute and chronic. There is little mass effect from this  hematoma as there  is substantial atrophy from the patient's stroke involving the right  frontal temporal and parietal regions.       Prior Functioning  Home Living Family/patient expects to be discharged to:: Private residence Living Arrangements: Children Available Help at Discharge: Family;Available 24 hours/day Type of Home: Apartment Home Access: Level entry Home Layout: One level Home Equipment: Bedside commode;Shower seat;Grab bars - toilet;Grab bars - tub/shower;Wheelchair - manual Additional Comments: hx provided by daughter Prior Function Level of Independence: Needs assistance Gait / Transfers Assistance Needed: patient w/c bound at baseline, SPT with min assist from fami ADL's / Homemaking Assistance Needed: Son assist daily with bathing, dressing, toileting Comments: Anticipate pt needed assistance prior to admission however no family present to determine assistance needed.  Communication Communication: Expressive difficulties;Prefers language other than English (hx of aphasia) Dominant Hand: Left    Cognition  Cognition Arousal/Alertness: Awake/alert Behavior During Therapy: WFL for tasks assessed/performed Overall Cognitive Status: Difficult to assess Difficult to assess due to: Non-English speaking;Impaired communication    Extremity/Trunk Assessment Upper Extremity Assessment Upper Extremity Assessment: RUE deficits/detail RUE Deficits / Details: flaccid right UE Lower Extremity Assessment Lower Extremity Assessment: RLE deficits/detail RLE Deficits / Details: At least 2/5 gross    Balance Balance Balance Assessed: Yes Static Sitting Balance Static Sitting - Balance Support: Feet supported Static Sitting - Level of Assistance: 6: Modified independent (Device/Increase time);5: Stand by assistance Static Sitting - Comment/# of Minutes: EOB  End of Session PT - End of Session Equipment Utilized During Treatment: Gait belt Activity Tolerance: Patient tolerated  treatment well Patient left: in chair;with call bell/phone within reach;with family/visitor present Nurse Communication: Mobility status  GP     Fabio Asa 11/17/2013, 10:16 AM Charlotte Crumb, PT DPT  [phone]

## 2013-11-17 NOTE — Progress Notes (Signed)
Dr. Danielle Dess discussed patient care with daughter with answering questions at this time. Plan to continue to monitor patient.

## 2013-11-17 NOTE — Procedures (Signed)
Objective Swallowing Evaluation: Modified Barium Swallowing Study  Patient Details  Name: [name] [name] MRN: [id number] Date of Birth: 1930-08-03  Today's Date: 11/17/2013 Time: 4098-1191 SLP Time Calculation (min): 25 min  Past Medical History:  Past Medical History  Diagnosis Date  . Stroke   . Thyroid disease   . Hypertension   . Asthma    Past Surgical History: History reviewed. No pertinent past surgical history. HPI:  Patient is an 79 year old right-handed individual who's had a previous left-sided stroke affecting his speech and also right-sided movement the patient had what appears to be a seizure that started with a typical jacksonian march involving the right upper extremity and then becoming generalized. A typical postictal phase. He is now returned to his normal neurologic status where he has a substantial right-sided and he plegia. A CT scan demonstrates the presence of a left-sided subdural hematoma that is subacute and chronic. There is little mass effect from this hematoma as there is substantial atrophy from the patient's stroke involving the right  frontal temporal and parietal regions.      Assessment / Plan / Recommendation Clinical Impression  Dysphagia Diagnosis: Mild oral phase dysphagia;Mild pharyngeal phase dysphagia Clinical impression: Pt presents with a mild oral dyspahgia secondary to right lingual and labial weakness. Pt had mild struggle masticating solids and clearing bolus fully from right buccal cavity, though he did clear fully and independnetly with extra time. Initially intake was impulsive with liquids. Overlarge, overfast intake, tilting head all the way back, combined with a delay in swallow initiaotn led to 1-2 instances of deep flash penetration and sensed frank penetration. When instructed to drink more slowly and carefully, pt tolerated liquids without aspiration though delay still present. Pt may upgrade to dys 3/mechanical soft with thin liquids  with full supervision for small sips initially. SLP will f/u to reinforce.     Treatment Recommendation  Therapy as outlined in treatment plan below    Diet Recommendation Thin liquid;Dysphagia 3 (Mechanical Soft)   Liquid Administration via: Cup;No straw Medication Administration: Whole meds with puree Supervision: Full supervision/cueing for compensatory strategies;Staff to assist with self feeding Compensations: Slow rate;Small sips/bites;Check for anterior loss Postural Changes and/or Swallow Maneuvers: Seated upright 90 degrees;Upright 30-60 min after meal    Other  Recommendations Oral Care Recommendations: Oral care BID   Follow Up Recommendations  None    Frequency and Duration min 2x/week  1 week   Pertinent Vitals/Pain NA    SLP Swallow Goals     General HPI: Patient is an 79 year old right-handed individual who's had a previous left-sided stroke affecting his speech and also right-sided movement the patient had what appears to be a seizure that started with a typical jacksonian march involving the right upper extremity and then becoming generalized. A typical postictal phase. He is now returned to his normal neurologic status where he has a substantial right-sided and he plegia. A CT scan demonstrates the presence of a left-sided subdural hematoma that is subacute and chronic. There is little mass effect from this hematoma as there is substantial atrophy from the patient's stroke involving the right  frontal temporal and parietal regions.  Type of Study: Modified Barium Swallowing Study Reason for Referral: Objectively evaluate swallowing function Previous Swallow Assessment: BSE-06/11/13; Dys 3 diet, thin liquids Diet Prior to this Study: Dysphagia 3 (soft);Nectar-thick liquids Temperature Spikes Noted: No Respiratory Status: Room air History of Recent Intubation: No Behavior/Cognition: Alert;Cooperative Oral Cavity - Dentition: Adequate natural dentition;Missing  dentition Oral Motor /  Sensory Function: Impaired - see Bedside swallow eval Self-Feeding Abilities: Able to feed self Patient Positioning: Upright in chair Baseline Vocal Quality: Clear Volitional Cough: Strong Volitional Swallow: Able to elicit Anatomy: Within functional limits    Reason for Referral Objectively evaluate swallowing function   Oral Phase Oral Preparation/Oral Phase Oral Phase: Impaired Oral - Thin Oral - Thin Cup: Within functional limits Oral - Thin Straw: Within functional limits Oral - Solids Oral - Puree: Delayed oral transit;Piecemeal swallowing;Lingual/palatal residue Oral - Mechanical Soft: Lingual/palatal residue;Piecemeal swallowing;Delayed oral transit   Pharyngeal Phase Pharyngeal Phase Pharyngeal Phase: Impaired Pharyngeal - Nectar Pharyngeal - Nectar Cup: Delayed swallow initiation;Premature spillage to pyriform sinuses Pharyngeal - Thin Pharyngeal - Thin Cup: Penetration/Aspiration before swallow;Delayed swallow initiation;Premature spillage to pyriform sinuses Penetration/Aspiration details (thin cup): Material enters airway, CONTACTS cords then ejected out;Material does not enter airway Pharyngeal - Thin Straw: Penetration/Aspiration before swallow;Delayed swallow initiation;Premature spillage to pyriform sinuses Penetration/Aspiration details (thin straw): Material enters airway, remains ABOVE vocal cords then ejected out;Material does not enter airway Pharyngeal - Solids Pharyngeal - Puree: Delayed swallow initiation;Premature spillage to pyriform sinuses Pharyngeal - Regular: Delayed swallow initiation;Premature spillage to pyriform sinuses Pharyngeal - Pill: Not tested  Cervical Esophageal Phase    GO    Cervical Esophageal Phase Cervical Esophageal Phase: [facility]        Harlon Ditty, MA CCC-SLP [phone]  Cordelle Dahmen, Riley Nearing 11/17/2013, 1:43 PM

## 2013-11-17 NOTE — Progress Notes (Signed)
Left message for Dr. Danielle Dess, nerurosurgery for follow up with patient to see what recommendations are to be made for pt. Care.

## 2013-11-18 NOTE — Progress Notes (Signed)
[facility] PROGRESS NOTE  [name] [name] WGN:[id number] DOB: April 04, 1937 DOA: 11/15/2013 PCP: Provider Not In System  Assessment/Plan: H/o Stroke  -With dysarthria, facial droop and right-sided weakness  -Plavix and aspirin are currently on hold  -Will await further information from neurology neurosurgery as to if and when these can be resumed  - Continue physical therapy   Subdural hematoma  -Patient previously had a hygroma which is suggestive of a subdural hemorrhage prior to this one  -Due to extensive underlying encephalomalacia, there has been no resultant neurologic injury from this current subdural  -Repeat CT scan reveals the subdural hematoma to be stable  - Patient's daughter discussed with neurosurgery who want to avoid surgery at this time and would like to followup in the clinic in 2 weeks with a repeat CT scan.  Seizure -EEG shows findings suggestive of structural lesion involving the left hemisphere. -No more seizures since patient is on Keppra 500 mg by mouth twice a day  Thyroid disease  -Continue medications   Hypertension  -Amlodipine has been resumed  - Blood pressure is stable  Asthma  -Currently stable    Code Status: Full code Family Communication: *Discussed with daughter at the bedside Disposition Plan: Home when stable   Consultants:  Neurology  Neurosurgery   Procedures:  *EEG  Antibiotics:  None  HPI/Subjective: Patient seen and examined, is alert and communicating though still has some slurred speech. Patient was seen by speech therapy yesterday and has been started on dysphagia 2 diet with nectar thick liquid. Daughter at the bedside. CT head done yesterday shows large subacute subdural hematoma on the left which is unchanged with persistent 2 mm midline shift to the right. No more seizures, currently he is on Keppra. Denies new complaints.  Objective: Filed Vitals:   11/18/13 1042  BP: 135/79  Pulse: 83  Temp: 98.1 F  (36.7 C)  Resp: 18   No intake or output data in the 24 hours ending 11/18/13 1336 Filed Weights   11/15/13 2300 11/16/13 2101 11/18/13 0500  Weight: 63.1 kg (139 lb 1.8 oz) 63.1 kg (139 lb 1.8 oz) 64.5 kg (142 lb 3.2 oz)    Exam:  Physical Exam: Head: Normocephalic, atraumatic.  Eyes: No signs of jaundice, EOMI Nose: Mucous membranes dry.  Throat: Oropharynx nonerythematous, no exudate appreciated.  Neck: supple,No deformities, masses, or tenderness noted. Lungs: Normal respiratory effort. B/L Clear to auscultation, no crackles or wheezes.  Heart: Regular RR. S1 and S2 normal  Abdomen: BS normoactive. Soft, Nondistended, non-tender.  Extremities: No pretibial edema, no erythema  Data Reviewed: Basic Metabolic Panel:  Recent Labs Lab 11/15/13 1600 11/15/13 1611 11/16/13 0415  NA 138 140 140  K 4.1 4.0 3.8  CL 101 99 103  CO2 28  --  26  GLUCOSE 147* 146* 101*  BUN 15 15 13   CREATININE 0.78 1.10 0.80  CALCIUM 9.3  --  9.5   Liver Function Tests:  Recent Labs Lab 11/15/13 1600  AST 22  ALT 18  ALKPHOS 88  BILITOT 0.1*  PROT 7.5  ALBUMIN 2.8*   No results found for this basename: LIPASE, AMYLASE,  in the last 168 hours No results found for this basename: AMMONIA,  in the last 168 hours CBC:  Recent Labs Lab 11/15/13 1600 11/15/13 1611 11/16/13 0415  WBC 6.8  --  5.4  NEUTROABS 4.4  --   --   HGB 11.1* 11.6* 10.4*  HCT 34.6* 34.0* 32.7*  MCV 85.6  --  85.6  PLT 218  --  209   Cardiac Enzymes:  Recent Labs Lab 11/15/13 1600  TROPONINI <0.30   BNP (last 3 results) No results found for this basename: PROBNP,  in the last 8760 hours CBG:  Recent Labs Lab 11/15/13 1547 11/15/13 2111  GLUCAP 142* 123*    Recent Results (from the past 240 hour(s))  MRSA PCR SCREENING     Status: None   Collection Time    11/16/13  4:08 AM      Result Value Range Status   MRSA by PCR NEGATIVE  NEGATIVE Final   Comment:            The GeneXpert MRSA  Assay (FDA     approved for NASAL specimens     only), is one component of a     comprehensive MRSA colonization     surveillance program. It is not     intended to diagnose MRSA     infection nor to guide or     monitor treatment for     MRSA infections.     Studies: Dg Swallowing Func-speech Pathology  11/17/2013   Riley Nearing [place]bl[place], CCC-SLP     11/17/2013  1:44 PM Objective Swallowing Evaluation: Modified Barium Swallowing Study   Patient Details  Name: [name] [name] MRN: [id number] Date of Birth: Apr 27, 1930  Today's Date: 11/17/2013 Time: 4098-1191 SLP Time Calculation (min): 25 min  Past Medical History:  Past Medical History  Diagnosis Date  . Stroke   . Thyroid disease   . Hypertension   . Asthma    Past Surgical History: History reviewed. No pertinent past  surgical history. HPI:  Patient is an 79 year old right-handed individual who's had a  previous left-sided stroke affecting his speech and also  right-sided movement the patient had what appears to be a seizure  that started with a typical jacksonian march involving the right  upper extremity and then becoming generalized. A typical  postictal phase. He is now returned to his normal neurologic  status where he has a substantial right-sided and he plegia. A CT  scan demonstrates the presence of a left-sided subdural hematoma  that is subacute and chronic. There is little mass effect from  this hematoma as there is substantial atrophy from the patient's  stroke involving the right  frontal temporal and parietal  regions.      Assessment / Plan / Recommendation Clinical Impression  Dysphagia Diagnosis: Mild oral phase dysphagia;Mild pharyngeal  phase dysphagia Clinical impression: Pt presents with a mild oral dyspahgia  secondary to right lingual and labial weakness. Pt had mild  struggle masticating solids and clearing bolus fully from right  buccal cavity, though he did clear fully and independnetly with  extra time. Initially intake was  impulsive with liquids.  Overlarge, overfast intake, tilting head all the way back,  combined with a delay in swallow initiaotn led to 1-2 instances  of deep flash penetration and sensed frank penetration. When  instructed to drink more slowly and carefully, pt tolerated  liquids without aspiration though delay still present. Pt may  upgrade to dys 3/mechanical soft with thin liquids with full  supervision for small sips initially. SLP will f/u to reinforce.     Treatment Recommendation  Therapy as outlined in treatment plan below    Diet Recommendation Thin liquid;Dysphagia 3 (Mechanical Soft)   Liquid Administration via: Cup;No straw Medication Administration: Whole meds with puree Supervision: Full supervision/cueing for compensatory  strategies;Staff to assist  with self feeding Compensations: Slow rate;Small sips/bites;Check for anterior loss Postural Changes and/or Swallow Maneuvers: Seated upright 90  degrees;Upright 30-60 min after meal    Other  Recommendations Oral Care Recommendations: Oral care BID   Follow Up Recommendations  None    Frequency and Duration min 2x/week  1 week   Pertinent Vitals/Pain NA    SLP Swallow Goals     General HPI: Patient is an 79 year old right-handed individual  who's had a previous left-sided stroke affecting his speech and  also right-sided movement the patient had what appears to be a  seizure that started with a typical jacksonian march involving  the right upper extremity and then becoming generalized. A  typical postictal phase. He is now returned to his normal  neurologic status where he has a substantial right-sided and he  plegia. A CT scan demonstrates the presence of a left-sided  subdural hematoma that is subacute and chronic. There is little  mass effect from this hematoma as there is substantial atrophy  from the patient's stroke involving the right  frontal temporal  and parietal regions.  Type of Study: Modified Barium Swallowing Study Reason for Referral:  Objectively evaluate swallowing function Previous Swallow Assessment: BSE-06/11/13; Dys 3 diet, thin  liquids Diet Prior to this Study: Dysphagia 3 (soft);Nectar-thick liquids Temperature Spikes Noted: No Respiratory Status: Room air History of Recent Intubation: No Behavior/Cognition: Alert;Cooperative Oral Cavity - Dentition: Adequate natural dentition;Missing  dentition Oral Motor / Sensory Function: Impaired - see Bedside swallow  eval Self-Feeding Abilities: Able to feed self Patient Positioning: Upright in chair Baseline Vocal Quality: Clear Volitional Cough: Strong Volitional Swallow: Able to elicit Anatomy: Within functional limits    Reason for Referral Objectively evaluate swallowing function   Oral Phase Oral Preparation/Oral Phase Oral Phase: Impaired Oral - Thin Oral - Thin Cup: Within functional limits Oral - Thin Straw: Within functional limits Oral - Solids Oral - Puree: Delayed oral transit;Piecemeal  swallowing;Lingual/palatal residue Oral - Mechanical Soft: Lingual/palatal residue;Piecemeal  swallowing;Delayed oral transit   Pharyngeal Phase Pharyngeal Phase Pharyngeal Phase: Impaired Pharyngeal - Nectar Pharyngeal - Nectar Cup: Delayed swallow initiation;Premature  spillage to pyriform sinuses Pharyngeal - Thin Pharyngeal - Thin Cup: Penetration/Aspiration before  swallow;Delayed swallow initiation;Premature spillage to pyriform  sinuses Penetration/Aspiration details (thin cup): Material enters  airway, CONTACTS cords then ejected out;Material does not enter  airway Pharyngeal - Thin Straw: Penetration/Aspiration before  swallow;Delayed swallow initiation;Premature spillage to pyriform  sinuses Penetration/Aspiration details (thin straw): Material enters  airway, remains ABOVE vocal cords then ejected out;Material does  not enter airway Pharyngeal - Solids Pharyngeal - Puree: Delayed swallow initiation;Premature spillage  to pyriform sinuses Pharyngeal - Regular: Delayed swallow  initiation;Premature  spillage to pyriform sinuses Pharyngeal - Pill: Not tested  Cervical Esophageal Phase    GO    Cervical Esophageal Phase Cervical Esophageal Phase: [facility]        Harlon Ditty, MA CCC-SLP [phone]  [place], Riley Nearing 11/17/2013, 1:43 PM     Scheduled Meds: . amLODipine  2.5 mg Oral QAC breakfast  . atorvastatin  40 mg Oral QHS  . finasteride  5 mg Oral QPC breakfast  . levETIRAcetam  500 mg Oral BID  . levothyroxine  75 mcg Oral QAC breakfast  . mirabegron ER  25 mg Oral QPC breakfast  . QUEtiapine  25 mg Oral QHS  . senna-docusate  1 tablet Oral QPC supper  . senna-docusate  2 tablet Oral QPC breakfast  . sodium chloride  3  mL Intravenous Q12H  . tamsulosin  0.4 mg Oral QPC breakfast   Continuous Infusions:   Principal Problem:   Seizure Active Problems:   Stroke   Thyroid disease   Hypertension   Asthma   Subdural hematoma    Time spent: 35 min    [facility]  [facility] Pager [phone]. If 7PM-7AM, please contact night-coverage at [email or web address], password [facility] 11/18/2013, 1:36 PM  LOS: 3 days

## 2013-11-19 MED ORDER — LEVETIRACETAM 500 MG PO TABS: 500.0000 mg | ORAL_TABLET | Freq: Two times a day (BID) | ORAL | Status: AC

## 2013-11-19 MED ORDER — HYDROCODONE-ACETAMINOPHEN 5-325 MG PO TABS: 1.0000 | ORAL_TABLET | ORAL | Status: DC | PRN

## 2013-11-19 MED ORDER — FINASTERIDE 5 MG PO TABS: 5.0000 mg | ORAL_TABLET | Freq: Every day | ORAL | Status: AC

## 2013-11-19 NOTE — Progress Notes (Signed)
Discharge pt as ordered. Discharge instructions provided to daughter and verbalized understanding of instructions, copies given  To significant other. No concerns/issues raised. Pt is alert in no acute distress, left facility in wheelchair accompanied by staff and family.

## 2013-11-19 NOTE — Progress Notes (Signed)
   CARE MANAGEMENT NOTE 11/19/2013  Patient:  [name] [name]   Account Number:  [id number]  Date Initiated:  11/16/2013  Documentation initiated by:  Donn Pierini  Subjective/Objective Assessment:   Pt admitted with new stroke     Action/Plan:   PTA pt lived at home with family- has home cpap- PT/OT evals pending- NCM to follow for recommendation and d/c needs   Anticipated DC Date:  11/17/2013   Anticipated DC Plan:  HOME W HOME HEALTH SERVICES      DC Planning Services  CM consult      [facility] Choice  Resumption Of Svcs/PTA Provider   Choice offered to / List presented to:          [facility] arranged  HH-2 PT      [facility] agency  [facility]   Status of service:  Completed, signed off Medicare Important Message given?   (If response is "NO", the following Medicare IM given date fields will be blank) Date Medicare IM given:   Date Additional Medicare IM given:    Discharge Disposition:  HOME W HOME HEALTH SERVICES  Per UR Regulation:  Reviewed for med. necessity/level of care/duration of stay  If discussed at Long Length of Stay Meetings, dates discussed:    Comments:  11/19/13 10:23 MD called CM to arrange Resumption of HH with [facility] for HHPT.  [facility] was called and [facility] requested we send facesheet, order, PT/OT evals, H&P, DC summary.  Aforementioned  faxed to [facility] at [phone]. [facility] states they will resume HH services w/in 48 hours. no other Cm needs were communicated.  Freddy Jaksch, BSN, CM [phone]

## 2013-11-19 NOTE — Discharge Summary (Signed)
Physician Discharge Summary  [name] [name] BJY:[id number] DOB: [birth year] DOA: 11/15/2013  PCP: Provider Not In System  Admit date: 11/15/2013 Discharge date: 11/19/2013  Time spent: *55 minutes  Recommendations for Outpatient Follow-up:  1. Followup neurosurgery in 2 weeks to decide resumption of aspirin or Plavix for stroke prevention 2. Followup neurology in 4 weeks 3. Follow PCP in 2 weeks  Discharge Diagnoses:  Principal Problem:   Seizure Active Problems:   Stroke   Thyroid disease   Hypertension   Asthma   Subdural hematoma   Discharge Condition: Stable  Diet recommendation: Mechanical soft dysphagia 3 diet  Filed Weights   11/16/13 2101 11/18/13 0500 11/19/13 0500  Weight: 63.1 kg (139 lb 1.8 oz) 64.5 kg (142 lb 3.2 oz) 65.5 kg (144 lb 6.4 oz)    History of present illness:  79 y.o. male, who has had left MCA territory ischemic stroke in the past causing a sizable encephalomalacia, right-sided hemiparesis secondary to previous CVA, hypertension, asthma, hypothyroidism, dyslipidemia who sustained a fall at his house about a month ago at which point he was brought in the ER and had an unremarkable workup except for head CT showing a subdural hygroma, he was subsequently discharged home and did well.  This afternoon patient had an episode of generalized tonic-clonic seizure which lasted a few seconds followed by a brief postictal phase, EMS was called however by the time EMS arrived patient was already recovering and was getting close to his baseline. He was then brought to the ER where her head CT now showed a subacute subdural hematoma with 3 mm midline shift, patient's mentation was close to his baseline and had no new focal deficits. He was seen in the ER by neurosurgeon Dr. Danielle Dess who requested me to admit the patient. Patient was also seen by neuro hospitalist team and was started on Keppra to prevent seizure recurrence.   Hospital Course:  Hodge Fredin is a 79 y.o.  male, who has had left MCA territory ischemic stroke in the past causing a sizable encephalomalacia, right-sided hemiparesis secondary to previous CVA, hypertension, asthma, hypothyroidism, dyslipidemia who sustained a fall at his house about a month ago at which point he was brought in the ER and had an unremarkable workup except for head CT showing a subdural hygroma, he was subsequently discharged home and did well.  The patient was brought to the hospital for a generalized tonic-clonic seizure. By the time he arrived to the ER, he was at baseline mental status. Imaging of the head revealed a large subdural hematoma which was resulting in a 3 mm midline shift. Neurosurgery and neurology were consult. Per neurosurgery, there was no necessity for surgery at the time of admission. It was decided to monitor the patient with serial imaging. MRI revealed the hematoma to be subacute to chronic and it is highly likely that it occurred as a result of the fall that happened about a month ago. Patient, who is quite alert now, has no recollection of any other injury after this fall.   Subdural hematoma  -Patient previously had a hygroma which is suggestive of a subdural hemorrhage prior to this one  -Due to extensive underlying encephalomalacia, there has been no resultant neurologic injury from this current subdural hematoma -Repeat CT scan reveals the subdural hematoma to be stable  -Patient's daughter spoke to the neurosurgeon, who recommends to follow up in 2 weeks and they will do repeat CT scan to check for resolution of hematoma. -I checked with  neurology and it went to continue to hold antiplatelet therapy to the patient is seen by neurosurgeon in 2 weeks. And neurosurgeon we'll make recommendation when to restart the antiplatelet therapy.  Seizure  -EEG shows findings suggestive of structural lesion involving the left hemisphere.  -No more seizures since patient is on Keppra 500 mg by mouth twice a day    Thyroid disease  -Continue medications   Hypertension  -Amlodipine has been resumed  - Blood pressure is stable   Asthma  -Currently stable   H/o Stroke  -With dysarthria, facial droop and right-sided weakness  -Plavix and aspirin are currently on hold  - Patient will follow with neurosurgery in 2 weeks. And they can decide at that time when to restart antiplatelet therapy  Patient has been getting physical therapy at home and will be discharged on home health PT. Home health PT will be resumed.  Procedures:  EEG  Consultations:  Neurology  Neurosurgery  Discharge Exam: Filed Vitals:   11/19/13 0500  BP: 142/73  Pulse: 62  Temp: 97.2 F (36.2 C)  Resp: 18    General: *Appear in no acute distress* Cardiovascular: *S1-S2 regular Respiratory: *Clear bilaterally Neuro-right hemiparesis  Discharge Instructions  Discharge Orders   Future Orders Complete By Expires   Diet - low sodium heart healthy  As directed    Discharge instructions  As directed    Comments:     Resume home health physical therapy   Increase activity slowly  As directed        Medication List    STOP taking these medications       clopidogrel 75 MG tablet  Commonly known as:  PLAVIX      TAKE these medications       amLODipine 2.5 MG tablet  Commonly known as:  NORVASC  Take 2.5 mg by mouth daily.     atorvastatin 40 MG tablet  Commonly known as:  LIPITOR  Take 40 mg by mouth at bedtime.     calcium carbonate 1250 MG tablet  Commonly known as:  OS-CAL - dosed in mg of elemental calcium  Take 1 tablet by mouth 2 (two) times daily with a meal.     finasteride 5 MG tablet  Commonly known as:  PROSCAR  Take 1 tablet (5 mg total) by mouth daily after breakfast.     HYDROcodone-acetaminophen 5-325 MG per tablet  Commonly known as:  NORCO/VICODIN  Take 1-2 tablets by mouth every 4 (four) hours as needed for moderate pain.     levETIRAcetam 500 MG tablet  Commonly known  as:  KEPPRA  Take 1 tablet (500 mg total) by mouth 2 (two) times daily.     levothyroxine 75 MCG tablet  Commonly known as:  SYNTHROID, LEVOTHROID  Take 75 mcg by mouth daily before breakfast.     mirabegron ER 25 MG Tb24 tablet  Commonly known as:  MYRBETRIQ  Take 25 mg by mouth daily after breakfast.     pyridOXINE 100 MG tablet  Commonly known as:  VITAMIN B-6  Take 200 mg by mouth daily after breakfast.     QUEtiapine 25 MG tablet  Commonly known as:  SEROQUEL  Take 25 mg by mouth at bedtime.     senna-docusate 8.6-50 MG per tablet  Commonly known as:  Senokot-S  Take 1-2 tablets by mouth 2 (two) times daily. 2 tablets right after breakfast and 1 tablet right after dinner     silodosin 8 MG  Caps capsule  Commonly known as:  RAPAFLO  Take 8 mg by mouth daily after supper.     Vitamin D 2000 UNITS Caps  Take 2,000 Units by mouth daily after breakfast.       No Known Allergies     Follow-up Information   Follow up with Stefani Dama, MD In 2 weeks.   Specialty:  Neurosurgery   Contact information:   [address] [place] [address] [phone]       Follow up with Gates Rigg, MD In 4 weeks.   Specialties:  Neurology, Radiology   Contact information:   [address] [place] [address] [phone]       Follow up with Provider Not In System In 2 weeks.       The results of significant diagnostics from this hospitalization (including imaging, microbiology, ancillary and laboratory) are listed below for reference.    Significant Diagnostic Studies: Dg Chest 2 View  11/15/2013   CLINICAL DATA:  Subdural hematoma, admission radiograph  EXAM: CHEST  2 VIEW  COMPARISON:  None.  FINDINGS: Heart size within normal limits. Mild aortic tortuosity and central vascular prominence. No confluent airspace opacity, pleural effusion, or pneumothorax. Multilevel degenerative changes. No acute osseous finding.  IMPRESSION: No  radiographic evidence of an acute cardiopulmonary process.   Electronically Signed   By: Jearld Lesch M.D.   On: 11/15/2013 23:24   Dg Shoulder Right  10/22/2013   CLINICAL DATA:  Right shoulder pain following a fall.  EXAM: RIGHT SHOULDER - 2+ VIEW  COMPARISON:  None.  FINDINGS: Diffuse osteopenia. No fracture or dislocation seen. Mild to moderate inferior acromioclavicular spur formation.  IMPRESSION: No fracture or dislocation.   Electronically Signed   By: Gordan Payment M.D.   On: 10/22/2013 23:10   Dg Elbow Complete Right  10/22/2013   CLINICAL DATA:  Right elbow pain following a fall.  EXAM: RIGHT ELBOW - COMPLETE 3+ VIEW  COMPARISON:  None.  FINDINGS: The lateral view is limited by limited ability to positioned the patient. There is a fragmented olecranon spur. There is also spur formation at the radial head and neck junction, making it difficult to assess for a fracture in the region. Also noted is mild olecranon and anterior humeral spur formation. No gross effusion on the lateral view. Diffuse osteopenia.  IMPRESSION: 1. Chronically fragmented versus acutely fractured olecranon spur. 2. Radial head and neck spur formation, making it difficult to assess for a fracture in that region. 3. Additional degenerative spur formation, as described above. 4. Limited lateral view with no gross effusion. 5.   Electronically Signed   By: Gordan Payment M.D.   On: 10/22/2013 23:20   Dg Hip Complete Right  10/22/2013   CLINICAL DATA:  Right hip pain following a fall.  EXAM: RIGHT HIP - COMPLETE 2+ VIEW  COMPARISON:  None.  FINDINGS: Diffuse osteopenia. No fracture or dislocation seen. Lower lumbar spine degenerative changes.  IMPRESSION: No fracture or dislocation.   Electronically Signed   By: Gordan Payment M.D.   On: 10/22/2013 23:09   Ct Head Wo Contrast  11/16/2013   CLINICAL DATA:  Subdural hemorrhage.  EXAM: CT HEAD WITHOUT CONTRAST  TECHNIQUE: Contiguous axial images were obtained from the base of the  skull through the vertex without intravenous contrast.  COMPARISON:  11/15/2013  FINDINGS: Large subdural hematoma on the left is unchanged. This appears subacute with a hematocrit level. Smaller left occipital high-density  subdural hematoma also unchanged.  Large area of chronic left MCA infarct with volume loss. Chronic ischemia in the frontal and parietal white matter on the right. Chronic left parietal infarct posteriorly. Negative for acute infarct.  Mild midline shift measuring 2 mm to the right.  IMPRESSION: Large subacute subdural hematoma on the left is unchanged. Large left MCA chronic infarct. 2 mm midline shift to the right is unchanged.   Electronically Signed   By: Marlan Palau M.D.   On: 11/16/2013 09:28   Ct Head (brain) Wo Contrast  11/15/2013   CLINICAL DATA:  Code stroke  EXAM: CT HEAD WITHOUT CONTRAST  TECHNIQUE: Contiguous axial images were obtained from the base of the skull through the vertex without intravenous contrast.  COMPARISON:  None.  FINDINGS: Large area of chronic infarct in the left MCA territory. Chronic right frontal infarct. Chronic infarct in the right parietal white matter.  Large mixed density subdural hematoma on the left with a hematocrit level. This measures 24 mm in thickness. Smaller area of higher density hematoma in the subdural space posteriorly on the left.  3 mm midline shift to the right related to extensive volume loss from chronic left MCA infarct. No acute ischemic infarct or mass.  IMPRESSION: Atrophy and chronic ischemia. Large area of chronic infarct in the left MCA territory.  Large mixed density subacute subdural hematoma on the left with 3 mm midline shift.  Critical Value/emergent results were called by telephone at the time of interpretation on 11/15/2013 at 4:05 PM to Dr.Camilo , who verbally acknowledged these results.   Electronically Signed   By: Marlan Palau M.D.   On: 11/15/2013 16:06   Ct Head Wo Contrast  10/22/2013   CLINICAL DATA:  Fall   EXAM: CT HEAD WITHOUT CONTRAST  TECHNIQUE: Contiguous axial images were obtained from the base of the skull through the vertex without intravenous contrast.  COMPARISON:  None.  FINDINGS: There is age-related atrophy with chronic microvascular ischemic disease.  There is extensive encephalomalacia throughout the left frontal and anterior temporal lobes, consistent with remote left MCA territory infarct. There is involvement of the left basal ganglia as well. There is associated mild ex vacuo dilatation of the left lateral ventricle. There is a homogeneous hypodense extra-axial fluid collection overlying the left cerebral hemisphere measuring 7 mm in maximal diameter, most compatible with a subdural hygroma related to volume loss. Scattered areas of encephalomalacia within the anterior right frontal lobe (series 2, image 17) and right centrum semi ovale (series 2, image 19) are also consistent with remote infarcts. Remote left occipital infarct also noted. No midline shift. No hydrocephalus. No acute intracranial hemorrhage or infarct identified.  The calvarium is intact. Orbits are within normal limits. Scattered mucoperiosteal thickening is present within the ethmoidal air cells bilaterally. Paranasal sinuses are otherwise clear. No mastoid effusion.  IMPRESSION: 1. No acute intracranial process. 2. Extensive encephalomalacia throughout the left frontal and temporal lobes, compatible with remote left MCA territory infarct. 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 No midline shift or hydrocephalus. 3. Additional remote right frontal and left occipital infarcts as above. 4. Atrophy with chronic microvascular ischemic changes.   Electronically Signed   By: Rise Mu M.D.   On: 10/22/2013 23:02   Dg Swallowing Func-speech Pathology  11/17/2013   Riley Nearing Deblois, CCC-SLP     11/17/2013  1:44 PM Objective  Swallowing Evaluation: Modified Barium Swallowing Study  Patient Details  Name: Jailan Trimm MRN: [id number] Date of Birth: August 09, 1930  Today's Date: 11/17/2013 Time: 4098-1191 SLP Time Calculation (min): 25 min  Past Medical History:  Past Medical History  Diagnosis Date  . Stroke   . Thyroid disease   . Hypertension   . Asthma    Past Surgical History: History reviewed. No pertinent past  surgical history. HPI:  Patient is an 79 year old right-handed individual who's had a  previous left-sided stroke affecting his speech and also  right-sided movement the patient had what appears to be a seizure  that started with a typical jacksonian march involving the right  upper extremity and then becoming generalized. A typical  postictal phase. He is now returned to his normal neurologic  status where he has a substantial right-sided and he plegia. A CT  scan demonstrates the presence of a left-sided subdural hematoma  that is subacute and chronic. There is little mass effect from  this hematoma as there is substantial atrophy from the patient's  stroke involving the right  frontal temporal and parietal  regions.      Assessment / Plan / Recommendation Clinical Impression  Dysphagia Diagnosis: Mild oral phase dysphagia;Mild pharyngeal  phase dysphagia Clinical impression: Pt presents with a mild oral dyspahgia  secondary to right lingual and labial weakness. Pt had mild  struggle masticating solids and clearing bolus fully from right  buccal cavity, though he did clear fully and independnetly with  extra time. Initially intake was impulsive with liquids.  Overlarge, overfast intake, tilting head all the way back,  combined with a delay in swallow initiaotn led to 1-2 instances  of deep flash penetration and sensed frank penetration. When  instructed to drink more slowly and carefully, pt tolerated  liquids without aspiration though delay still present. Pt may  upgrade to dys 3/mechanical soft with thin liquids with full   supervision for small sips initially. SLP will f/u to reinforce.     Treatment Recommendation  Therapy as outlined in treatment plan below    Diet Recommendation Thin liquid;Dysphagia 3 (Mechanical Soft)   Liquid Administration via: Cup;No straw Medication Administration: Whole meds with puree Supervision: Full supervision/cueing for compensatory  strategies;Staff to assist with self feeding Compensations: Slow rate;Small sips/bites;Check for anterior loss Postural Changes and/or Swallow Maneuvers: Seated upright 90  degrees;Upright 30-60 min after meal    Other  Recommendations Oral Care Recommendations: Oral care BID   Follow Up Recommendations  None    Frequency and Duration min 2x/week  1 week   Pertinent Vitals/Pain NA    SLP Swallow Goals     General HPI: Patient is an 79 year old right-handed individual  who's had a previous left-sided stroke affecting his speech and  also right-sided movement the patient had what appears to be a  seizure that started with a typical jacksonian march involving  the right upper extremity and then becoming generalized. A  typical postictal phase. He is now returned to his normal  neurologic status where he has a substantial right-sided and he  plegia. A CT scan demonstrates the presence of a left-sided  subdural hematoma that is subacute and chronic. There is little  mass effect from this hematoma as there is substantial atrophy  from the patient's stroke involving the right  frontal temporal  and parietal regions.  Type of Study: Modified Barium Swallowing Study Reason for Referral: Objectively evaluate swallowing function Previous Swallow Assessment: BSE-06/11/13; Dys 3 diet, thin  liquids Diet Prior to this Study:  Dysphagia 3 (soft);Nectar-thick liquids Temperature Spikes Noted: No Respiratory Status: Room air History of Recent Intubation: No Behavior/Cognition: Alert;Cooperative Oral Cavity - Dentition: Adequate natural dentition;Missing  dentition Oral Motor / Sensory  Function: Impaired - see Bedside swallow  eval Self-Feeding Abilities: Able to feed self Patient Positioning: Upright in chair Baseline Vocal Quality: Clear Volitional Cough: Strong Volitional Swallow: Able to elicit Anatomy: Within functional limits    Reason for Referral Objectively evaluate swallowing function   Oral Phase Oral Preparation/Oral Phase Oral Phase: Impaired Oral - Thin Oral - Thin Cup: Within functional limits Oral - Thin Straw: Within functional limits Oral - Solids Oral - Puree: Delayed oral transit;Piecemeal  swallowing;Lingual/palatal residue Oral - Mechanical Soft: Lingual/palatal residue;Piecemeal  swallowing;Delayed oral transit   Pharyngeal Phase Pharyngeal Phase Pharyngeal Phase: Impaired Pharyngeal - Nectar Pharyngeal - Nectar Cup: Delayed swallow initiation;Premature  spillage to pyriform sinuses Pharyngeal - Thin Pharyngeal - Thin Cup: Penetration/Aspiration before  swallow;Delayed swallow initiation;Premature spillage to pyriform  sinuses Penetration/Aspiration details (thin cup): Material enters  airway, CONTACTS cords then ejected out;Material does not enter  airway Pharyngeal - Thin Straw: Penetration/Aspiration before  swallow;Delayed swallow initiation;Premature spillage to pyriform  sinuses Penetration/Aspiration details (thin straw): Material enters  airway, remains ABOVE vocal cords then ejected out;Material does  not enter airway Pharyngeal - Solids Pharyngeal - Puree: Delayed swallow initiation;Premature spillage  to pyriform sinuses Pharyngeal - Regular: Delayed swallow initiation;Premature  spillage to pyriform sinuses Pharyngeal - Pill: Not tested  Cervical Esophageal Phase    GO    Cervical Esophageal Phase Cervical Esophageal Phase: [facility]        Harlon Ditty, MA CCC-SLP [phone]  DeBlois, Riley Nearing 11/17/2013, 1:43 PM     Microbiology: Recent Results (from the past 240 hour(s))  MRSA PCR SCREENING     Status: None   Collection Time    11/16/13  4:08 AM       Result Value Range Status   MRSA by PCR NEGATIVE  NEGATIVE Final   Comment:            The GeneXpert MRSA Assay (FDA     approved for NASAL specimens     only), is one component of a     comprehensive MRSA colonization     surveillance program. It is not     intended to diagnose MRSA     infection nor to guide or     monitor treatment for     MRSA infections.     Labs: Basic Metabolic Panel:  Recent Labs Lab 11/15/13 1600 11/15/13 1611 11/16/13 0415  NA 138 140 140  K 4.1 4.0 3.8  CL 101 99 103  CO2 28  --  26  GLUCOSE 147* 146* 101*  BUN 15 15 13   CREATININE 0.78 1.10 0.80  CALCIUM 9.3  --  9.5   Liver Function Tests:  Recent Labs Lab 11/15/13 1600  AST 22  ALT 18  ALKPHOS 88  BILITOT 0.1*  PROT 7.5  ALBUMIN 2.8*   No results found for this basename: LIPASE, AMYLASE,  in the last 168 hours No results found for this basename: AMMONIA,  in the last 168 hours CBC:  Recent Labs Lab 11/15/13 1600 11/15/13 1611 11/16/13 0415  WBC 6.8  --  5.4  NEUTROABS 4.4  --   --   HGB 11.1* 11.6* 10.4*  HCT 34.6* 34.0* 32.7*  MCV 85.6  --  85.6  PLT 218  --  209   Cardiac Enzymes:  Recent Labs Lab 11/15/13 1600  TROPONINI <0.30   BNP: BNP (last 3 results) No results found for this basename: PROBNP,  in the last 8760 hours CBG:  Recent Labs Lab 11/15/13 1547 11/15/13 2111  GLUCAP 142* 123*       Signed:  Arran Fessel S  [facility] 11/19/2013, 11:22 AM

## 2013-12-08 ENCOUNTER — Ambulatory Visit
Admission: RE | Admit: 2013-12-08 | Discharge: 2013-12-08 | Disposition: A | Discharge status: Home / Self Care | Payer: BC Managed Care – PPO | Source: Ambulatory Visit | Attending: Neurological Surgery | Admitting: Neurological Surgery

## 2013-12-08 ENCOUNTER — Other Ambulatory Visit: Payer: Self-pay | Admitting: Neurological Surgery

## 2013-12-08 DIAGNOSIS — I6200 Nontraumatic subdural hemorrhage, unspecified: Secondary | ICD-10-CM

## 2013-12-13 ENCOUNTER — Other Ambulatory Visit: Payer: Self-pay | Admitting: Neurological Surgery

## 2013-12-13 DIAGNOSIS — I6200 Nontraumatic subdural hemorrhage, unspecified: Secondary | ICD-10-CM

## 2013-12-21 ENCOUNTER — Ambulatory Visit: Payer: BC Managed Care – PPO | Admitting: Neurology

## 2014-01-03 ENCOUNTER — Other Ambulatory Visit: Payer: BC Managed Care – PPO

## 2014-01-03 ENCOUNTER — Other Ambulatory Visit: Payer: Self-pay | Admitting: Neurological Surgery

## 2014-01-03 ENCOUNTER — Ambulatory Visit
Admission: RE | Admit: 2014-01-03 | Discharge: 2014-01-03 | Disposition: A | Discharge status: Home / Self Care | Payer: BC Managed Care – PPO | Source: Ambulatory Visit | Attending: Neurological Surgery | Admitting: Neurological Surgery

## 2014-01-03 DIAGNOSIS — I6200 Nontraumatic subdural hemorrhage, unspecified: Secondary | ICD-10-CM

## 2014-02-07 ENCOUNTER — Ambulatory Visit
Admission: RE | Admit: 2014-02-07 | Discharge: 2014-02-07 | Disposition: A | Discharge status: Home / Self Care | Payer: BC Managed Care – PPO | Source: Ambulatory Visit | Attending: Neurological Surgery | Admitting: Neurological Surgery

## 2014-02-07 DIAGNOSIS — I6200 Nontraumatic subdural hemorrhage, unspecified: Secondary | ICD-10-CM

## 2014-02-27 ENCOUNTER — Other Ambulatory Visit: Payer: Self-pay | Admitting: Neurological Surgery

## 2014-02-27 DIAGNOSIS — I6200 Nontraumatic subdural hemorrhage, unspecified: Secondary | ICD-10-CM

## 2014-04-04 ENCOUNTER — Other Ambulatory Visit: Payer: BC Managed Care – PPO

## 2014-04-05 ENCOUNTER — Other Ambulatory Visit: Payer: BC Managed Care – PPO

## 2014-04-06 IMAGING — CT CT HEAD W/O CM
2 series · 16 of 30 positions shown, 20 images · non-contrast
Comparison: CT 01/03/2014

CLINICAL DATA: Subdural hemorrhage followup

EXAM:
CT HEAD WITHOUT CONTRAST
TECHNIQUE: Contiguous axial images were obtained from the base of the skull
through the vertex without intravenous contrast.

[Series 3: head bone · axial · 0.49mm/px · z∈[+22,+65]mm · 3 of 28 slices shown]
[im 2/28  bone]
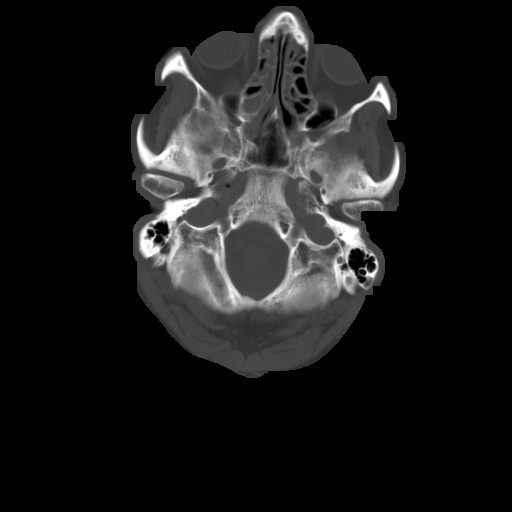
[im 6/28  bone]
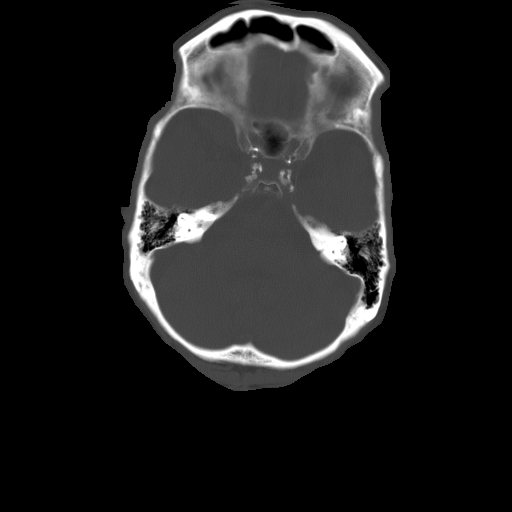
[im 10/28  bone]
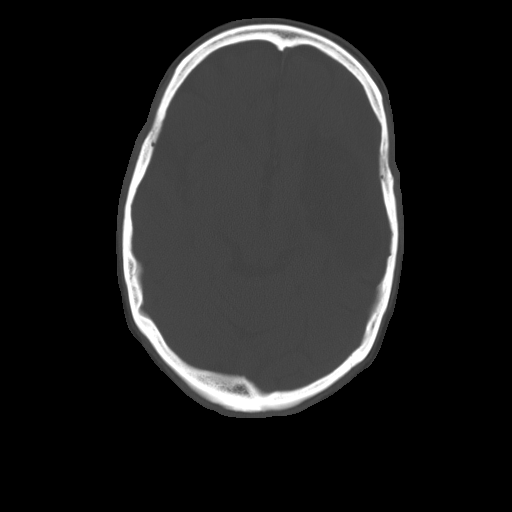

[Series 32: 3d filtered head w/o · axial · non-contrast · 0.49mm/px · z∈[+22,+153]mm · 13 of 28 slices shown, 17 images]
[im 2/28  brain]
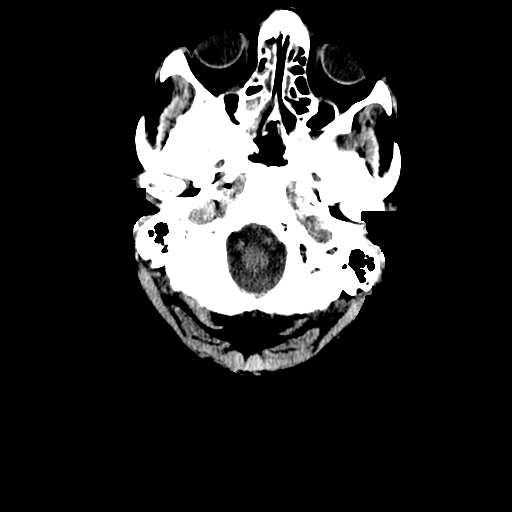
[im 2/28  bone]
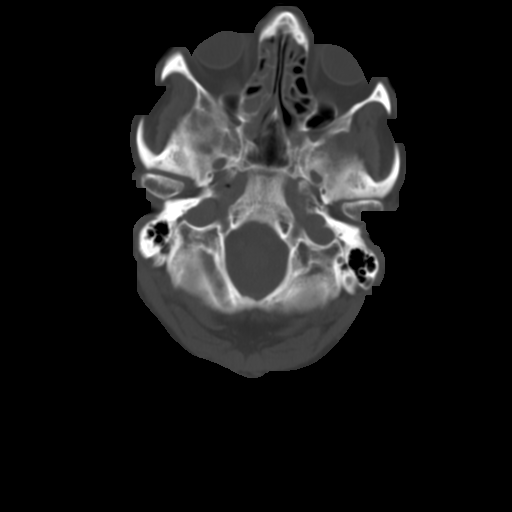
[im 4/28  brain]
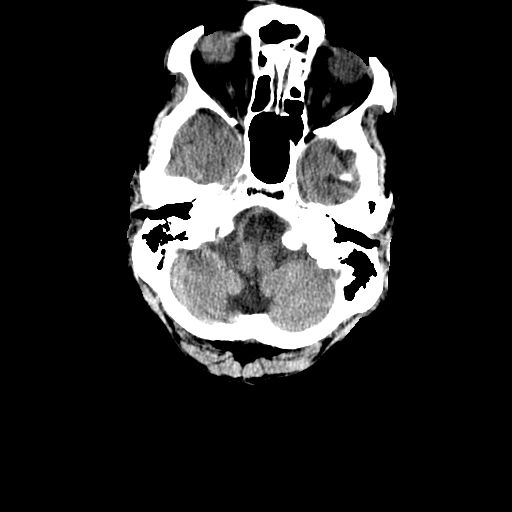
[im 6/28  brain]
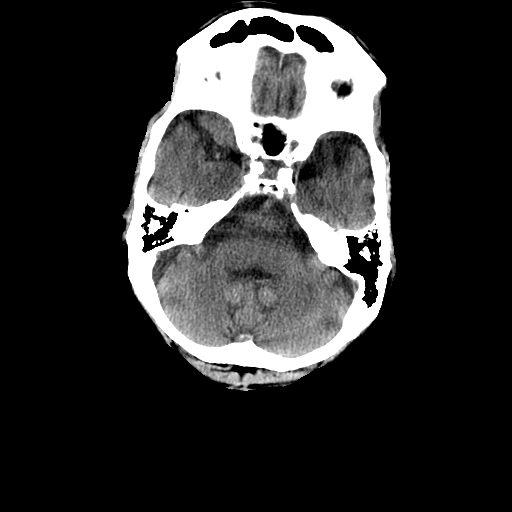
[im 8/28  brain]
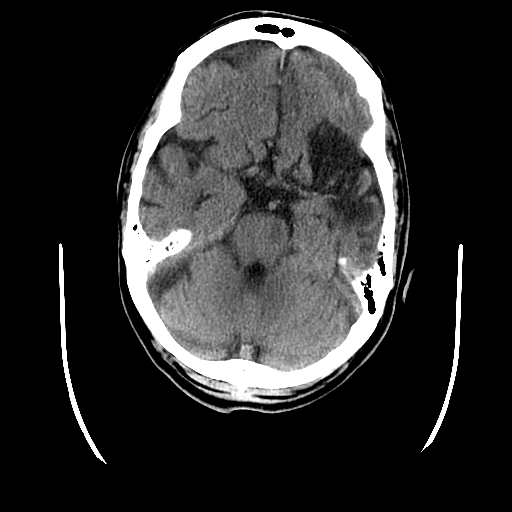
[im 10/28  brain]
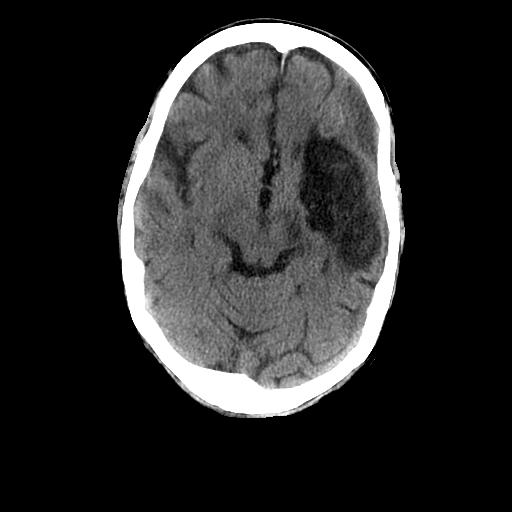
[im 10/28  bone]
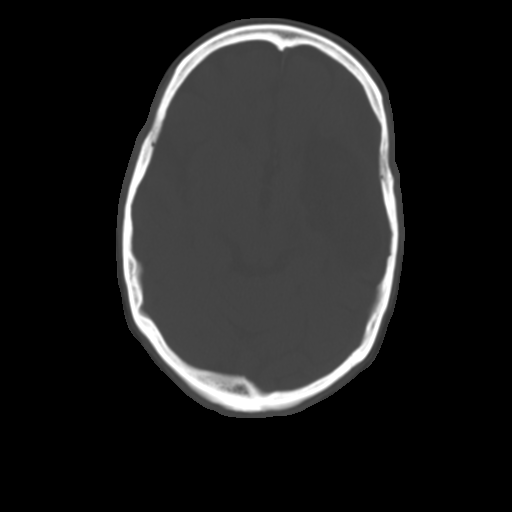
[im 12/28  brain]
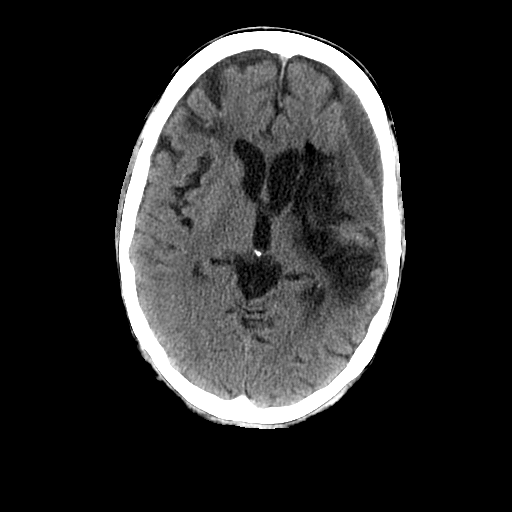
[im 14/28  brain]
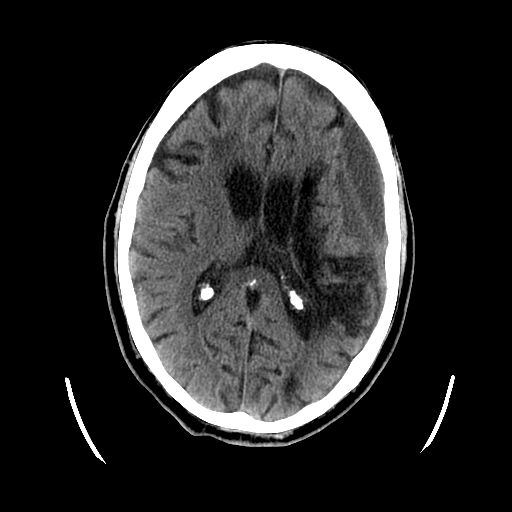
[im 16/28  brain]
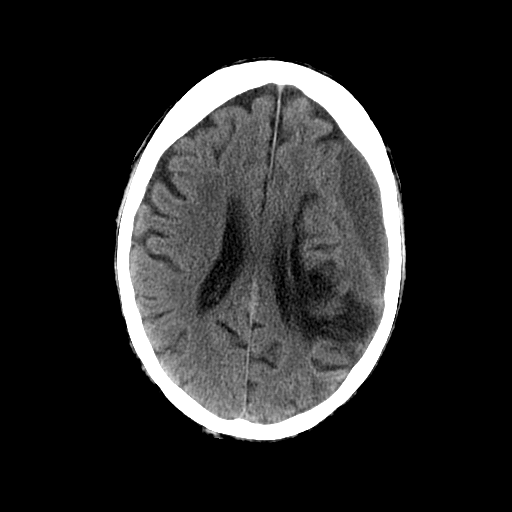
[im 18/28  brain]
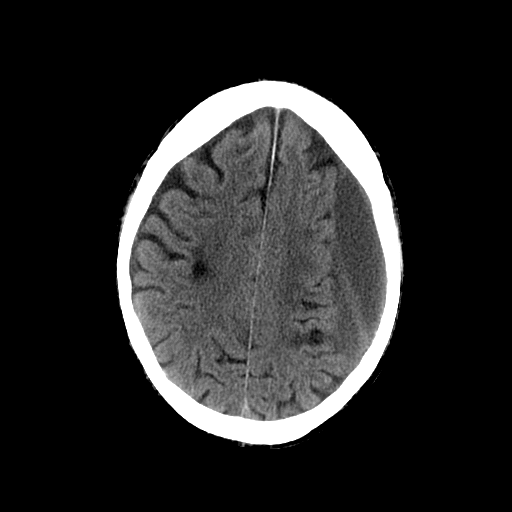
[im 18/28  bone]
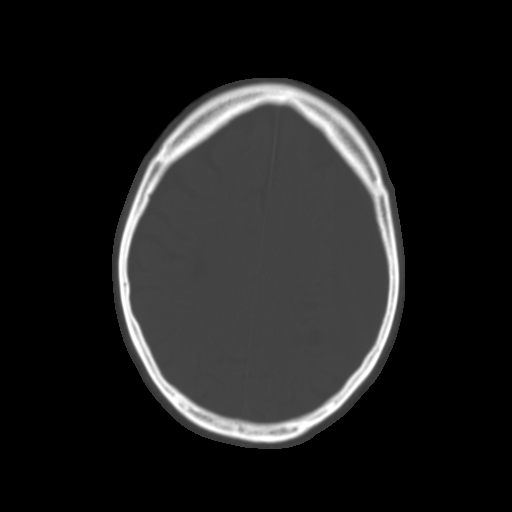
[im 20/28  brain]
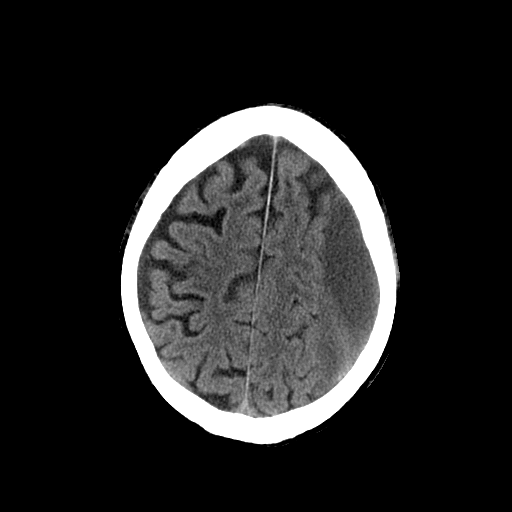
[im 22/28  brain]
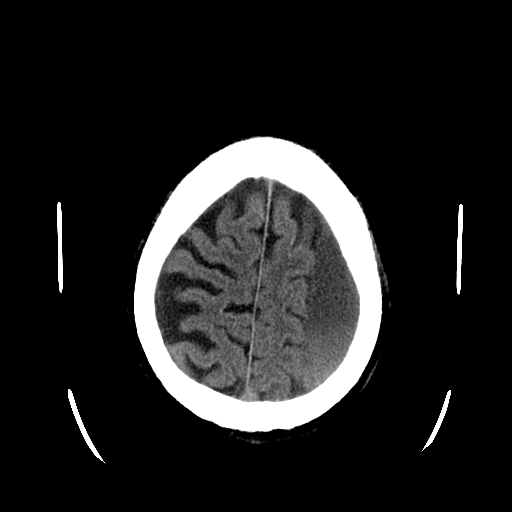
[im 24/28  brain]
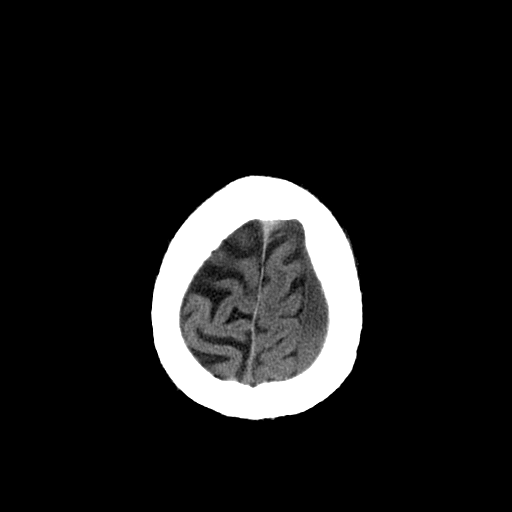
[im 26/28  brain]
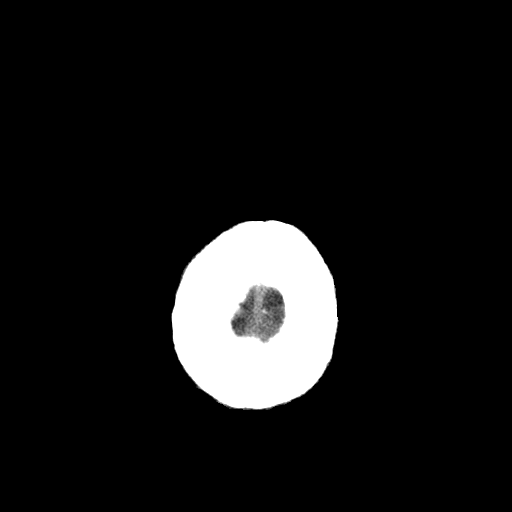
[im 26/28  bone]
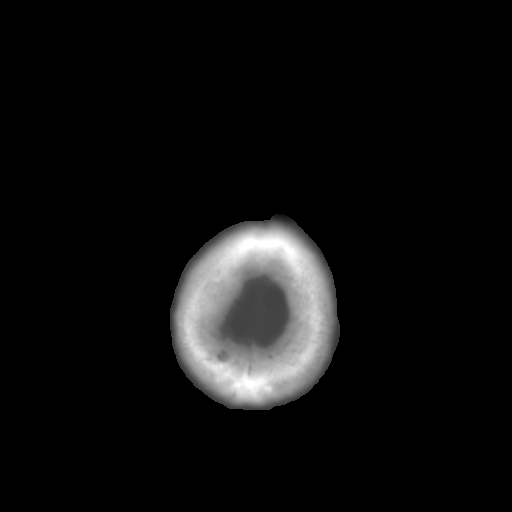

[16 of 30 positions shown; findings below may reference images not displayed]

FINDINGS: Low-density subdural hematoma left has improved in size. There is a
small amount of layering chronic blood posteriorly. Subdural
hemorrhage measures 26 mm in diameter over the convexity compared
with 33 mm on the prior study. There is a large left chronic MCA
infarct with volume loss, therefore there is minimal midline shift.
Midline shift has improved since prior study when it measured 5.4 mm
and now measures 1 mm to the right.

Generalized atrophy and chronic ischemic changes. No acute infarct
or mass.
IMPRESSION: Improving low-density subdural hematoma on the left with improvement
in mass effect and midline shift.

Chronic left MCA infarct is unchanged.

## 2015-03-15 DEATH — deceased
